# Patient Record
Sex: Female | Born: 1935 | Race: White | Hispanic: No | Smoking: Never smoker
Health system: Southern US, Community
[De-identification: ages and names within clinical notes are randomized; demographics above are authoritative.]

## PROBLEM LIST (undated history)

## (undated) DIAGNOSIS — E785 Hyperlipidemia, unspecified: Secondary | ICD-10-CM

## (undated) DIAGNOSIS — I10 Essential (primary) hypertension: Secondary | ICD-10-CM

## (undated) DIAGNOSIS — I2510 Atherosclerotic heart disease of native coronary artery without angina pectoris: Secondary | ICD-10-CM

## (undated) DIAGNOSIS — C801 Malignant (primary) neoplasm, unspecified: Secondary | ICD-10-CM

## (undated) DIAGNOSIS — I6529 Occlusion and stenosis of unspecified carotid artery: Secondary | ICD-10-CM

## (undated) HISTORY — DX: Essential (primary) hypertension: I10

## (undated) HISTORY — DX: Occlusion and stenosis of unspecified carotid artery: I65.29

## (undated) HISTORY — PX: CORONARY ARTERY BYPASS GRAFT: SHX141

## (undated) HISTORY — DX: Hyperlipidemia, unspecified: E78.5

## (undated) HISTORY — PX: ABDOMINAL HYSTERECTOMY: SHX81

## (undated) HISTORY — PX: CHOLECYSTECTOMY: SHX55

## (undated) HISTORY — PX: CATARACT EXTRACTION: SUR2

## (undated) HISTORY — DX: Atherosclerotic heart disease of native coronary artery without angina pectoris: I25.10

---

## 1964-10-02 HISTORY — PX: ABDOMINAL HYSTERECTOMY: SHX81

## 2005-09-08 ENCOUNTER — Ambulatory Visit: Payer: Self-pay | Admitting: Family Medicine

## 2005-10-25 ENCOUNTER — Ambulatory Visit (HOSPITAL_COMMUNITY): Admission: RE | Admit: 2005-10-25 | Discharge: 2005-10-25 | Payer: Self-pay | Admitting: *Deleted

## 2005-10-27 ENCOUNTER — Inpatient Hospital Stay (HOSPITAL_COMMUNITY): Admission: EM | Admit: 2005-10-27 | Discharge: 2005-11-02 | Payer: Self-pay | Admitting: Emergency Medicine

## 2005-10-27 HISTORY — PX: CARDIAC CATHETERIZATION: SHX172

## 2005-10-28 HISTORY — PX: CORONARY ARTERY BYPASS GRAFT: SHX141

## 2005-11-23 ENCOUNTER — Encounter
Admission: RE | Admit: 2005-11-23 | Discharge: 2005-11-23 | Payer: Self-pay | Admitting: Thoracic Surgery (Cardiothoracic Vascular Surgery)

## 2005-12-01 ENCOUNTER — Ambulatory Visit (HOSPITAL_COMMUNITY): Admission: RE | Admit: 2005-12-01 | Discharge: 2005-12-01 | Payer: Self-pay | Admitting: *Deleted

## 2005-12-05 ENCOUNTER — Ambulatory Visit (HOSPITAL_COMMUNITY)
Admission: RE | Admit: 2005-12-05 | Discharge: 2005-12-05 | Payer: Self-pay | Admitting: Thoracic Surgery (Cardiothoracic Vascular Surgery)

## 2005-12-05 ENCOUNTER — Encounter: Admission: RE | Admit: 2005-12-05 | Discharge: 2005-12-05 | Payer: Self-pay | Admitting: Surgery

## 2005-12-05 ENCOUNTER — Encounter (HOSPITAL_COMMUNITY): Admission: RE | Admit: 2005-12-05 | Discharge: 2006-01-04 | Payer: Self-pay | Admitting: *Deleted

## 2005-12-12 ENCOUNTER — Ambulatory Visit: Payer: Self-pay | Admitting: Family Medicine

## 2005-12-18 ENCOUNTER — Encounter
Admission: RE | Admit: 2005-12-18 | Discharge: 2005-12-18 | Payer: Self-pay | Admitting: Thoracic Surgery (Cardiothoracic Vascular Surgery)

## 2005-12-18 ENCOUNTER — Encounter (INDEPENDENT_AMBULATORY_CARE_PROVIDER_SITE_OTHER): Payer: Self-pay | Admitting: Family Medicine

## 2006-01-05 ENCOUNTER — Encounter (HOSPITAL_COMMUNITY): Admission: RE | Admit: 2006-01-05 | Discharge: 2006-02-04 | Payer: Self-pay | Admitting: *Deleted

## 2006-01-08 ENCOUNTER — Encounter
Admission: RE | Admit: 2006-01-08 | Discharge: 2006-01-08 | Payer: Self-pay | Admitting: Thoracic Surgery (Cardiothoracic Vascular Surgery)

## 2006-01-11 ENCOUNTER — Ambulatory Visit (HOSPITAL_COMMUNITY): Admission: RE | Admit: 2006-01-11 | Discharge: 2006-01-11 | Payer: Self-pay | Admitting: Family Medicine

## 2006-01-11 ENCOUNTER — Ambulatory Visit: Payer: Self-pay | Admitting: Family Medicine

## 2006-01-25 ENCOUNTER — Ambulatory Visit: Payer: Self-pay | Admitting: Family Medicine

## 2006-01-30 ENCOUNTER — Ambulatory Visit (HOSPITAL_COMMUNITY): Admission: RE | Admit: 2006-01-30 | Discharge: 2006-01-30 | Payer: Self-pay | Admitting: Family Medicine

## 2006-02-09 ENCOUNTER — Ambulatory Visit: Payer: Self-pay | Admitting: Family Medicine

## 2006-03-07 ENCOUNTER — Ambulatory Visit: Payer: Self-pay | Admitting: Family Medicine

## 2006-03-09 ENCOUNTER — Encounter (INDEPENDENT_AMBULATORY_CARE_PROVIDER_SITE_OTHER): Payer: Self-pay | Admitting: Family Medicine

## 2006-03-09 LAB — CONVERTED CEMR LAB
RBC count: 4.18 10*6/uL
TSH: 1.892 microintl units/mL
WBC, blood: 5.1 10*3/uL

## 2006-06-06 ENCOUNTER — Ambulatory Visit: Payer: Self-pay | Admitting: Family Medicine

## 2006-06-13 ENCOUNTER — Ambulatory Visit: Payer: Self-pay | Admitting: Family Medicine

## 2006-08-02 ENCOUNTER — Encounter (INDEPENDENT_AMBULATORY_CARE_PROVIDER_SITE_OTHER): Payer: Self-pay | Admitting: Family Medicine

## 2006-08-02 LAB — CONVERTED CEMR LAB: Blood Glucose, Fasting: 86 mg/dL

## 2006-08-13 ENCOUNTER — Ambulatory Visit: Payer: Self-pay | Admitting: Family Medicine

## 2006-09-06 ENCOUNTER — Encounter: Payer: Self-pay | Admitting: Family Medicine

## 2006-09-06 DIAGNOSIS — G43909 Migraine, unspecified, not intractable, without status migrainosus: Secondary | ICD-10-CM | POA: Insufficient documentation

## 2006-09-06 DIAGNOSIS — M810 Age-related osteoporosis without current pathological fracture: Secondary | ICD-10-CM | POA: Insufficient documentation

## 2006-09-06 DIAGNOSIS — M1990 Unspecified osteoarthritis, unspecified site: Secondary | ICD-10-CM | POA: Insufficient documentation

## 2006-09-06 DIAGNOSIS — I10 Essential (primary) hypertension: Secondary | ICD-10-CM | POA: Insufficient documentation

## 2006-09-06 DIAGNOSIS — K219 Gastro-esophageal reflux disease without esophagitis: Secondary | ICD-10-CM

## 2006-09-06 DIAGNOSIS — E785 Hyperlipidemia, unspecified: Secondary | ICD-10-CM | POA: Insufficient documentation

## 2006-09-19 ENCOUNTER — Ambulatory Visit: Payer: Self-pay | Admitting: Family Medicine

## 2006-10-24 ENCOUNTER — Ambulatory Visit: Payer: Self-pay | Admitting: Family Medicine

## 2006-11-07 ENCOUNTER — Ambulatory Visit: Payer: Self-pay | Admitting: Family Medicine

## 2006-11-21 ENCOUNTER — Ambulatory Visit: Payer: Self-pay | Admitting: Family Medicine

## 2006-11-21 ENCOUNTER — Ambulatory Visit (HOSPITAL_COMMUNITY): Admission: RE | Admit: 2006-11-21 | Discharge: 2006-11-21 | Payer: Self-pay | Admitting: Family Medicine

## 2006-11-21 DIAGNOSIS — Z951 Presence of aortocoronary bypass graft: Secondary | ICD-10-CM | POA: Insufficient documentation

## 2006-11-22 LAB — CONVERTED CEMR LAB
ALT: 47 units/L — ABNORMAL HIGH (ref 0–35)
AST: 39 units/L — ABNORMAL HIGH (ref 0–37)
Basophils Absolute: 0 10*3/uL (ref 0.0–0.1)
Basophils Relative: 0 % (ref 0–1)
CO2: 19 meq/L (ref 19–32)
Chloride: 113 meq/L — ABNORMAL HIGH (ref 96–112)
Eosinophils Relative: 3 % (ref 0–5)
Lymphocytes Relative: 25 % (ref 12–46)
Neutro Abs: 3.3 10*3/uL (ref 1.7–7.7)
Platelets: 213 10*3/uL (ref 150–400)
Pro B Natriuretic peptide (BNP): 581 pg/mL — ABNORMAL HIGH (ref 0.0–100.0)
RDW: 13.2 % (ref 11.5–14.0)
Sodium: 146 meq/L — ABNORMAL HIGH (ref 135–145)
Total Bilirubin: 0.5 mg/dL (ref 0.3–1.2)
Total Protein: 5.9 g/dL — ABNORMAL LOW (ref 6.0–8.3)

## 2006-11-23 ENCOUNTER — Ambulatory Visit: Payer: Self-pay | Admitting: Family Medicine

## 2006-11-23 DIAGNOSIS — D649 Anemia, unspecified: Secondary | ICD-10-CM

## 2006-11-26 ENCOUNTER — Ambulatory Visit (HOSPITAL_COMMUNITY): Admission: RE | Admit: 2006-11-26 | Discharge: 2006-11-26 | Payer: Self-pay | Admitting: Family Medicine

## 2006-12-05 ENCOUNTER — Encounter (INDEPENDENT_AMBULATORY_CARE_PROVIDER_SITE_OTHER): Payer: Self-pay | Admitting: Family Medicine

## 2006-12-05 LAB — CONVERTED CEMR LAB: OCCULT 3: NEGATIVE

## 2006-12-07 ENCOUNTER — Ambulatory Visit: Payer: Self-pay | Admitting: Family Medicine

## 2006-12-07 LAB — CONVERTED CEMR LAB
ALT: 15 units/L (ref 0–35)
AST: 23 units/L (ref 0–37)
Alkaline Phosphatase: 80 units/L (ref 39–117)
Folate: >20 ng/mL
Indirect Bilirubin: 0.3 mg/dL (ref 0.0–0.9)
Retic Count, Absolute: 38.8 (ref 19.0–186.0)
Retic Ct Pct: 1 % (ref 0.4–3.1)
Saturation Ratios: 15 % — ABNORMAL LOW (ref 20–55)
Total Protein: 6.1 g/dL (ref 6.0–8.3)
Vitamin B-12: 487 pg/mL (ref 211–911)

## 2006-12-10 ENCOUNTER — Encounter (INDEPENDENT_AMBULATORY_CARE_PROVIDER_SITE_OTHER): Payer: Self-pay | Admitting: Family Medicine

## 2006-12-12 ENCOUNTER — Encounter (INDEPENDENT_AMBULATORY_CARE_PROVIDER_SITE_OTHER): Payer: Self-pay | Admitting: Family Medicine

## 2007-01-04 ENCOUNTER — Encounter (INDEPENDENT_AMBULATORY_CARE_PROVIDER_SITE_OTHER): Payer: Self-pay | Admitting: Family Medicine

## 2007-01-09 ENCOUNTER — Ambulatory Visit: Payer: Self-pay | Admitting: Family Medicine

## 2007-01-09 ENCOUNTER — Other Ambulatory Visit: Admission: RE | Admit: 2007-01-09 | Discharge: 2007-01-09 | Payer: Self-pay | Admitting: Family Medicine

## 2007-01-09 ENCOUNTER — Encounter (INDEPENDENT_AMBULATORY_CARE_PROVIDER_SITE_OTHER): Payer: Self-pay | Admitting: *Deleted

## 2007-01-09 LAB — CONVERTED CEMR LAB
HDL goal, serum: 40 mg/dL
LDL Goal: 100 mg/dL

## 2007-01-16 ENCOUNTER — Ambulatory Visit (HOSPITAL_COMMUNITY): Admission: RE | Admit: 2007-01-16 | Discharge: 2007-01-16 | Payer: Self-pay | Admitting: Family Medicine

## 2007-01-16 ENCOUNTER — Encounter (INDEPENDENT_AMBULATORY_CARE_PROVIDER_SITE_OTHER): Payer: Self-pay | Admitting: Family Medicine

## 2007-01-18 ENCOUNTER — Encounter (INDEPENDENT_AMBULATORY_CARE_PROVIDER_SITE_OTHER): Payer: Self-pay | Admitting: Family Medicine

## 2007-02-06 ENCOUNTER — Ambulatory Visit: Payer: Self-pay | Admitting: Family Medicine

## 2007-02-08 ENCOUNTER — Telehealth (INDEPENDENT_AMBULATORY_CARE_PROVIDER_SITE_OTHER): Payer: Self-pay | Admitting: *Deleted

## 2007-02-13 ENCOUNTER — Encounter (INDEPENDENT_AMBULATORY_CARE_PROVIDER_SITE_OTHER): Payer: Self-pay | Admitting: Family Medicine

## 2007-02-28 ENCOUNTER — Encounter (INDEPENDENT_AMBULATORY_CARE_PROVIDER_SITE_OTHER): Payer: Self-pay | Admitting: Family Medicine

## 2007-03-07 ENCOUNTER — Encounter (INDEPENDENT_AMBULATORY_CARE_PROVIDER_SITE_OTHER): Payer: Self-pay | Admitting: Family Medicine

## 2007-04-18 ENCOUNTER — Encounter (INDEPENDENT_AMBULATORY_CARE_PROVIDER_SITE_OTHER): Payer: Self-pay | Admitting: Family Medicine

## 2007-04-24 ENCOUNTER — Telehealth (INDEPENDENT_AMBULATORY_CARE_PROVIDER_SITE_OTHER): Payer: Self-pay | Admitting: Family Medicine

## 2007-05-08 ENCOUNTER — Telehealth (INDEPENDENT_AMBULATORY_CARE_PROVIDER_SITE_OTHER): Payer: Self-pay | Admitting: *Deleted

## 2007-05-08 ENCOUNTER — Ambulatory Visit: Payer: Self-pay | Admitting: Family Medicine

## 2007-05-08 DIAGNOSIS — M5030 Other cervical disc degeneration, unspecified cervical region: Secondary | ICD-10-CM | POA: Insufficient documentation

## 2007-05-09 ENCOUNTER — Telehealth (INDEPENDENT_AMBULATORY_CARE_PROVIDER_SITE_OTHER): Payer: Self-pay | Admitting: *Deleted

## 2007-05-09 LAB — CONVERTED CEMR LAB
ALT: 16 units/L (ref 0–35)
CO2: 21 meq/L (ref 19–32)
Calcium: 8.4 mg/dL (ref 8.4–10.5)
Chloride: 108 meq/L (ref 96–112)
Cholesterol: 135 mg/dL (ref 0–200)
Creatinine, Ser: 0.83 mg/dL (ref 0.40–1.20)
Eosinophils Relative: 4 % (ref 0–5)
Glucose, Bld: 86 mg/dL (ref 70–99)
HCT: 36.1 % (ref 36.0–46.0)
Hemoglobin: 11.2 g/dL — ABNORMAL LOW (ref 12.0–15.0)
Lymphocytes Relative: 25 % (ref 12–46)
Lymphs Abs: 1.1 10*3/uL (ref 0.7–3.3)
Monocytes Absolute: 0.4 10*3/uL (ref 0.2–0.7)
Neutro Abs: 2.8 10*3/uL (ref 1.7–7.7)
RBC: 3.67 M/uL — ABNORMAL LOW (ref 3.87–5.11)
Total Bilirubin: 0.7 mg/dL (ref 0.3–1.2)
Total CHOL/HDL Ratio: 2.8
Total Protein: 6.5 g/dL (ref 6.0–8.3)
Triglycerides: 88 mg/dL (ref <150)
VLDL: 18 mg/dL (ref 0–40)
WBC: 4.5 10*3/uL (ref 4.0–10.5)

## 2007-05-10 ENCOUNTER — Encounter (INDEPENDENT_AMBULATORY_CARE_PROVIDER_SITE_OTHER): Payer: Self-pay | Admitting: Family Medicine

## 2007-05-10 ENCOUNTER — Telehealth (INDEPENDENT_AMBULATORY_CARE_PROVIDER_SITE_OTHER): Payer: Self-pay | Admitting: *Deleted

## 2007-05-10 ENCOUNTER — Ambulatory Visit (HOSPITAL_COMMUNITY): Admission: RE | Admit: 2007-05-10 | Discharge: 2007-05-10 | Payer: Self-pay | Admitting: Family Medicine

## 2007-05-13 ENCOUNTER — Telehealth (INDEPENDENT_AMBULATORY_CARE_PROVIDER_SITE_OTHER): Payer: Self-pay | Admitting: *Deleted

## 2007-05-13 ENCOUNTER — Encounter (INDEPENDENT_AMBULATORY_CARE_PROVIDER_SITE_OTHER): Payer: Self-pay | Admitting: Family Medicine

## 2007-05-16 ENCOUNTER — Encounter (INDEPENDENT_AMBULATORY_CARE_PROVIDER_SITE_OTHER): Payer: Self-pay | Admitting: Family Medicine

## 2007-05-16 ENCOUNTER — Ambulatory Visit (HOSPITAL_COMMUNITY): Admission: RE | Admit: 2007-05-16 | Discharge: 2007-05-16 | Payer: Self-pay | Admitting: Family Medicine

## 2007-05-17 ENCOUNTER — Encounter (INDEPENDENT_AMBULATORY_CARE_PROVIDER_SITE_OTHER): Payer: Self-pay | Admitting: Family Medicine

## 2007-05-22 ENCOUNTER — Ambulatory Visit: Payer: Self-pay | Admitting: Family Medicine

## 2007-05-23 ENCOUNTER — Telehealth (INDEPENDENT_AMBULATORY_CARE_PROVIDER_SITE_OTHER): Payer: Self-pay | Admitting: *Deleted

## 2007-05-23 ENCOUNTER — Encounter (INDEPENDENT_AMBULATORY_CARE_PROVIDER_SITE_OTHER): Payer: Self-pay | Admitting: Family Medicine

## 2007-05-23 LAB — CONVERTED CEMR LAB
Folate: >20 ng/mL
Iron: 75 ug/dL (ref 42–145)
Saturation Ratios: 23 % (ref 20–55)
UIBC: 246 ug/dL
Vitamin B-12: 718 pg/mL (ref 211–911)

## 2007-05-26 ENCOUNTER — Encounter (INDEPENDENT_AMBULATORY_CARE_PROVIDER_SITE_OTHER): Payer: Self-pay | Admitting: Family Medicine

## 2007-05-28 ENCOUNTER — Telehealth (INDEPENDENT_AMBULATORY_CARE_PROVIDER_SITE_OTHER): Payer: Self-pay | Admitting: *Deleted

## 2007-05-30 ENCOUNTER — Observation Stay (HOSPITAL_COMMUNITY): Admission: RE | Admit: 2007-05-30 | Discharge: 2007-05-31 | Payer: Self-pay | Admitting: Obstetrics and Gynecology

## 2007-05-30 ENCOUNTER — Encounter: Payer: Self-pay | Admitting: Obstetrics and Gynecology

## 2007-05-30 ENCOUNTER — Encounter (INDEPENDENT_AMBULATORY_CARE_PROVIDER_SITE_OTHER): Payer: Self-pay | Admitting: Family Medicine

## 2007-07-03 ENCOUNTER — Ambulatory Visit: Payer: Self-pay | Admitting: Family Medicine

## 2007-07-03 DIAGNOSIS — IMO0002 Reserved for concepts with insufficient information to code with codable children: Secondary | ICD-10-CM

## 2007-07-03 DIAGNOSIS — M4800 Spinal stenosis, site unspecified: Secondary | ICD-10-CM | POA: Insufficient documentation

## 2007-07-08 ENCOUNTER — Encounter (INDEPENDENT_AMBULATORY_CARE_PROVIDER_SITE_OTHER): Payer: Self-pay | Admitting: Family Medicine

## 2007-08-26 ENCOUNTER — Telehealth (INDEPENDENT_AMBULATORY_CARE_PROVIDER_SITE_OTHER): Payer: Self-pay | Admitting: Family Medicine

## 2007-09-30 ENCOUNTER — Encounter (INDEPENDENT_AMBULATORY_CARE_PROVIDER_SITE_OTHER): Payer: Self-pay | Admitting: Family Medicine

## 2007-10-03 ENCOUNTER — Encounter: Payer: Self-pay | Admitting: Family Medicine

## 2007-10-09 ENCOUNTER — Ambulatory Visit: Payer: Self-pay | Admitting: Family Medicine

## 2007-10-10 LAB — CONVERTED CEMR LAB
Basophils Absolute: 0 10*3/uL (ref 0.0–0.1)
Basophils Relative: 1 % (ref 0–1)
Eosinophils Absolute: 0.3 10*3/uL (ref 0.0–0.7)
MCHC: 31.9 g/dL (ref 30.0–36.0)
MCV: 91.6 fL (ref 78.0–100.0)
Neutro Abs: 2.6 10*3/uL (ref 1.7–7.7)
Neutrophils Relative %: 58 % (ref 43–77)
Platelets: 184 10*3/uL (ref 150–400)
RBC: 3.59 M/uL — ABNORMAL LOW (ref 3.87–5.11)
RDW: 14.4 % (ref 11.5–15.5)

## 2007-10-15 ENCOUNTER — Encounter (INDEPENDENT_AMBULATORY_CARE_PROVIDER_SITE_OTHER): Payer: Self-pay | Admitting: Family Medicine

## 2007-11-07 ENCOUNTER — Encounter (INDEPENDENT_AMBULATORY_CARE_PROVIDER_SITE_OTHER): Payer: Self-pay | Admitting: Family Medicine

## 2007-11-20 ENCOUNTER — Ambulatory Visit: Payer: Self-pay | Admitting: Family Medicine

## 2007-11-20 DIAGNOSIS — K5900 Constipation, unspecified: Secondary | ICD-10-CM | POA: Insufficient documentation

## 2007-11-20 LAB — CONVERTED CEMR LAB
Calcium: 9.3 mg/dL (ref 8.4–10.5)
Phosphorus: 3.5 mg/dL (ref 2.3–4.6)

## 2007-11-22 ENCOUNTER — Encounter (INDEPENDENT_AMBULATORY_CARE_PROVIDER_SITE_OTHER): Payer: Self-pay | Admitting: Family Medicine

## 2007-11-25 ENCOUNTER — Encounter (INDEPENDENT_AMBULATORY_CARE_PROVIDER_SITE_OTHER): Payer: Self-pay | Admitting: Family Medicine

## 2007-11-26 LAB — CONVERTED CEMR LAB: Creatinine, Ser: 0.86 mg/dL (ref 0.40–1.20)

## 2007-11-26 IMAGING — CR DG CHEST 1V PORT
1 series · 1 of 1 positions shown · non-contrast
Comparison: Earlier examination today at 2213 hours.

CLINICAL DATA: Unstable angina, pre CABG workup.
 PORTABLE CHEST ? 1 VIEW ? 0002 hours:

[view not recorded]
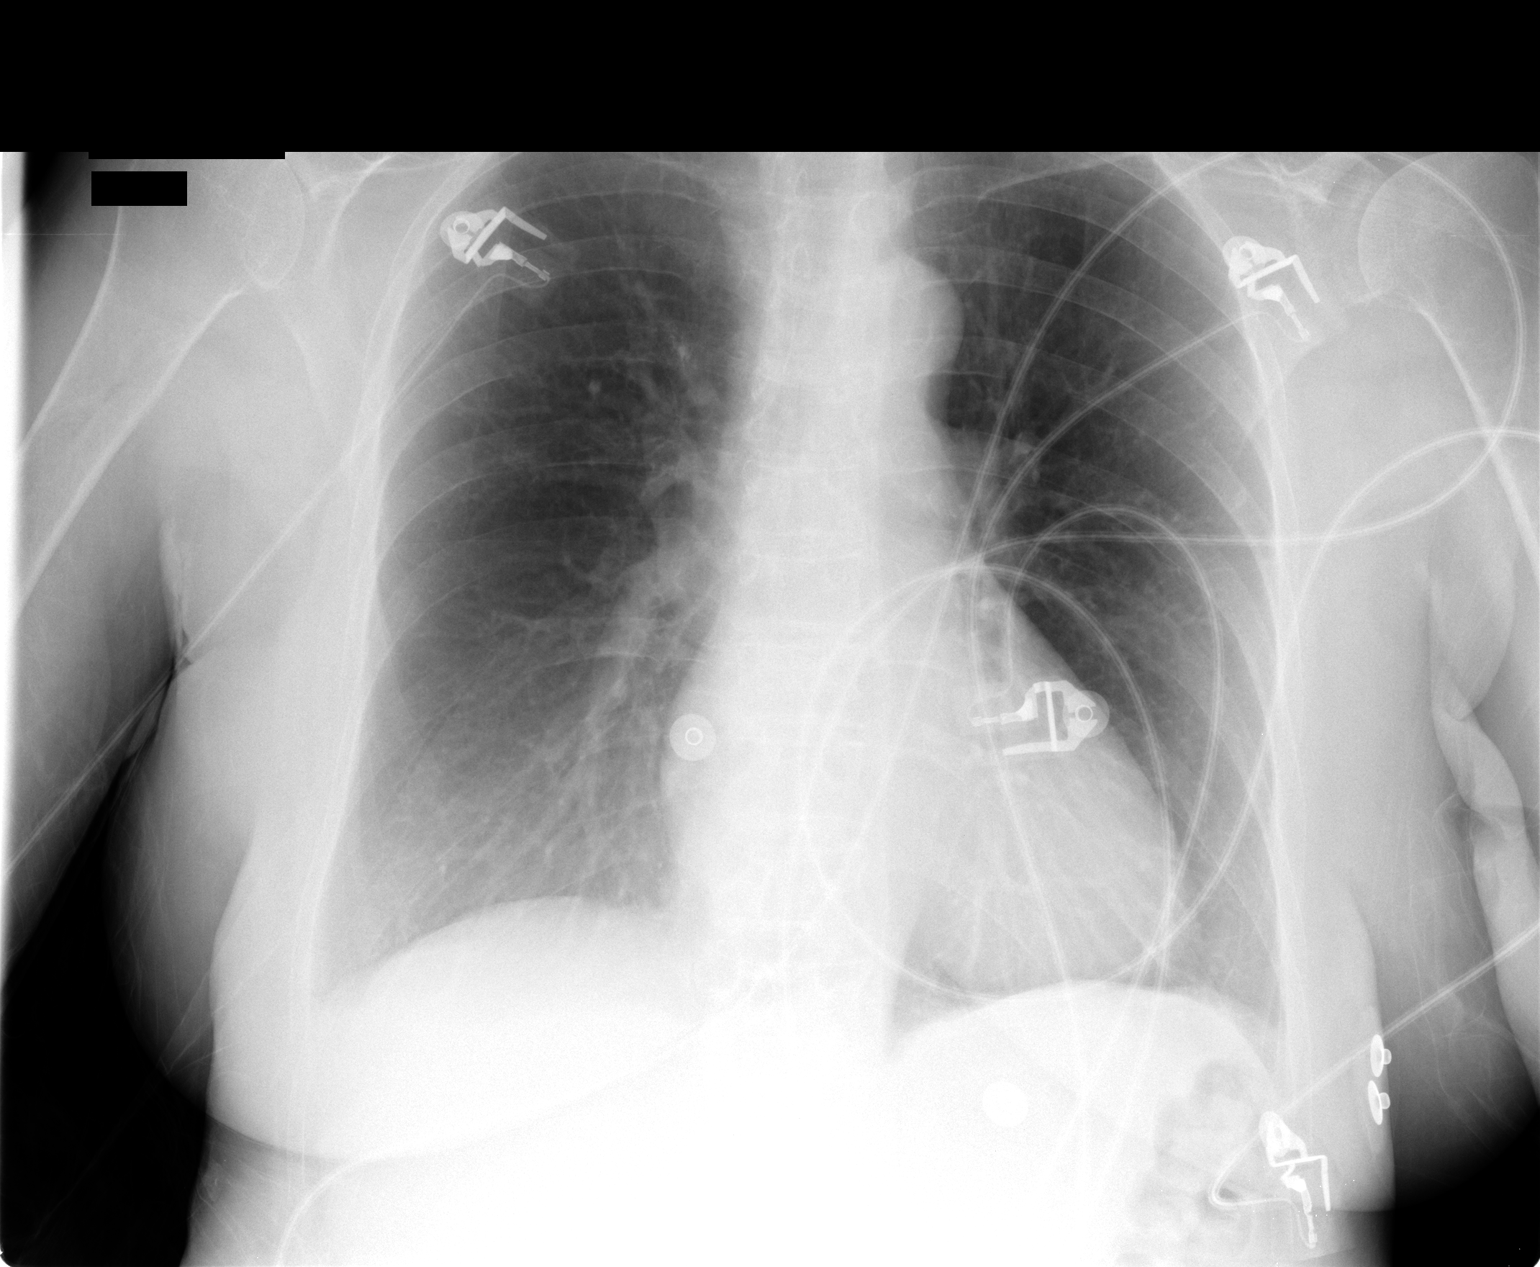

[1 of 1 positions shown; findings below may reference images not displayed]

FINDINGS: Cardiac size probably towards upper limits of normal.  Some redistribution of pulmonary blood flow to the upper lung zones suggesting mild pulmonary venous hypertension.  No CHF or active lung process.
IMPRESSION: Cardiac size mildly prominent for this single view.  Query pulmonary venous hypertension.  No CHF.

## 2007-11-27 IMAGING — CR DG CHEST 1V PORT
1 series · 1 of 1 positions shown · non-contrast
Comparison: 10/27/05.

CLINICAL DATA: Status post CABG.
 PORTABLE CHEST - 1 VIEW - 10/28/05 AT 5553 HOURS:

[view not recorded]
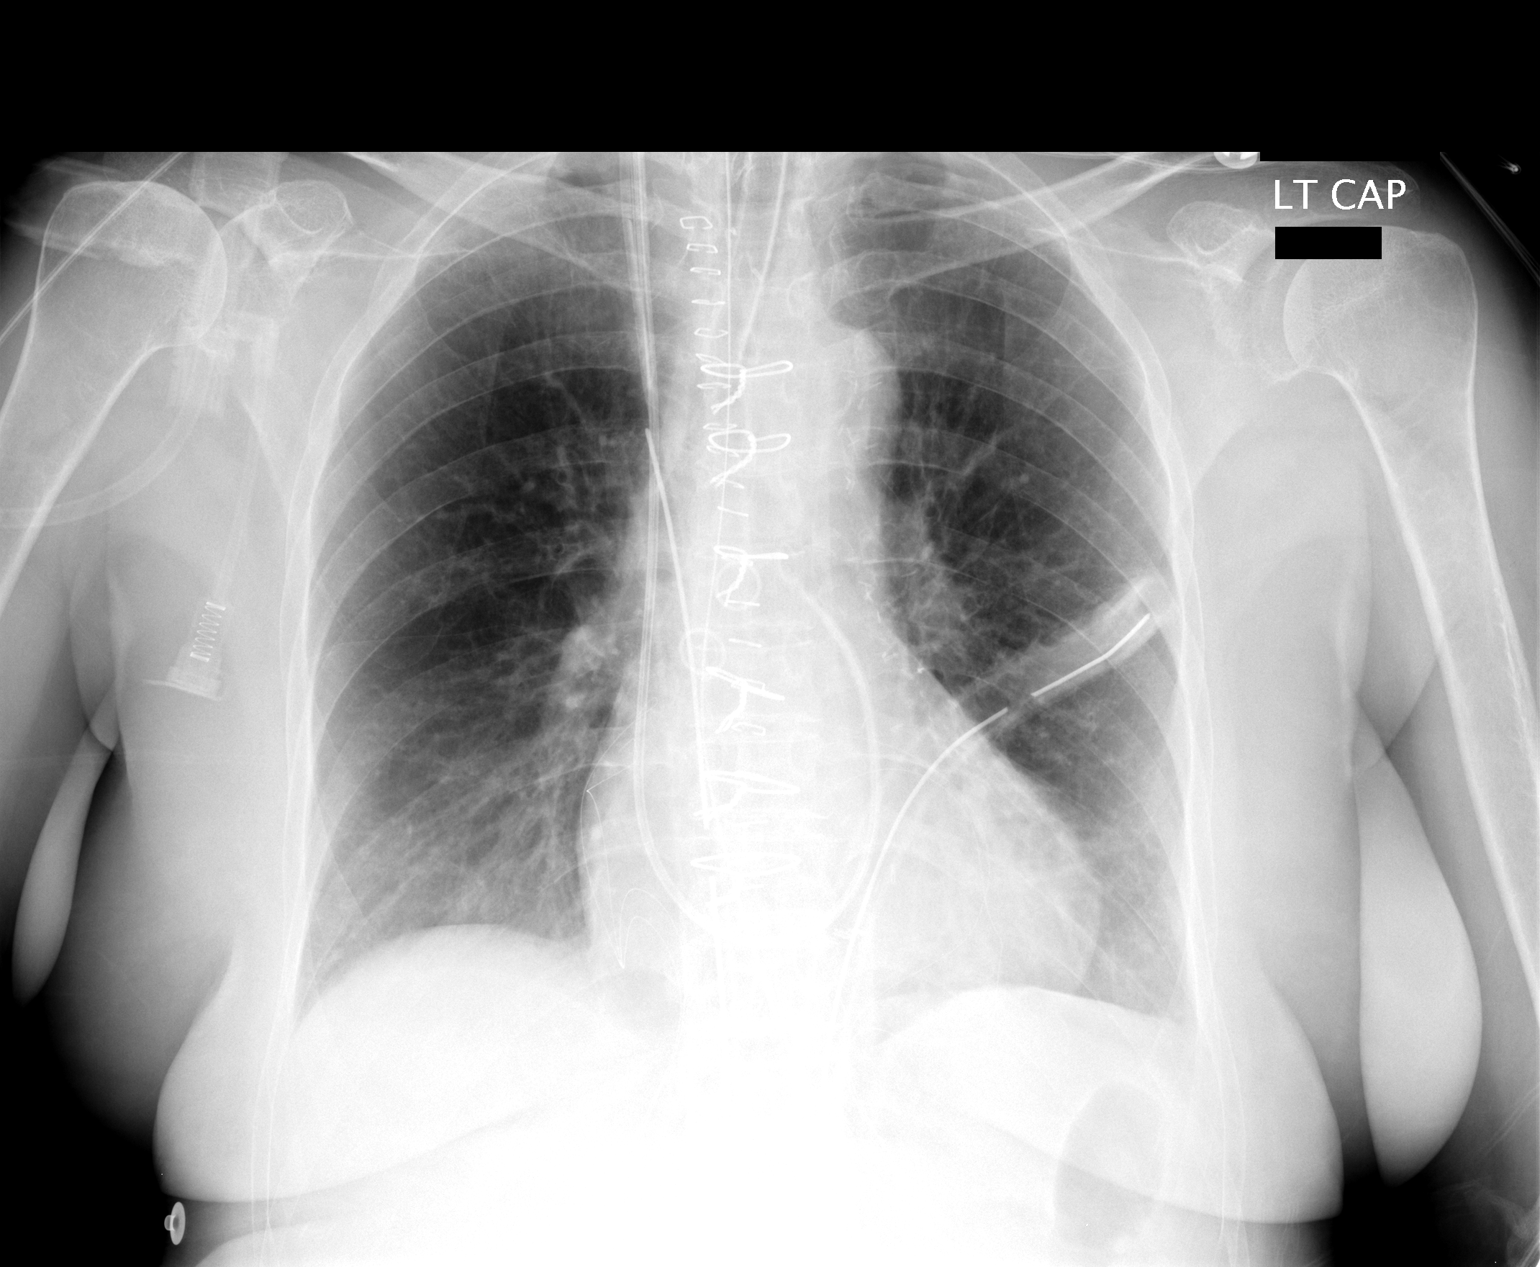

[1 of 1 positions shown; findings below may reference images not displayed]

FINDINGS: The endotracheal tube tip lies 2 cm above the carina.  The Swan-Ganz catheter tip is in the proximal right pulmonary artery.  A left chest tube is in place.  There is a small less than 5 percent left apical pneumothorax.  No significant atelectasis or edema.  Stable heart size postoperatively.
IMPRESSION: Less than 5 percent left apical pneumothorax.
 Satisfactory positioning of endotracheal tube and Swan-Ganz catheter.

## 2007-11-29 IMAGING — CR DG CHEST 1V PORT
1 series · 1 of 1 positions shown · non-contrast
Comparison: none

CLINICAL DATA: Unstable angina. 
 PORTABLE CHEST - 1 VIEW, 10/30/05, 2622 HOURS:
 Comparison;  10/29/05.

[view not recorded]
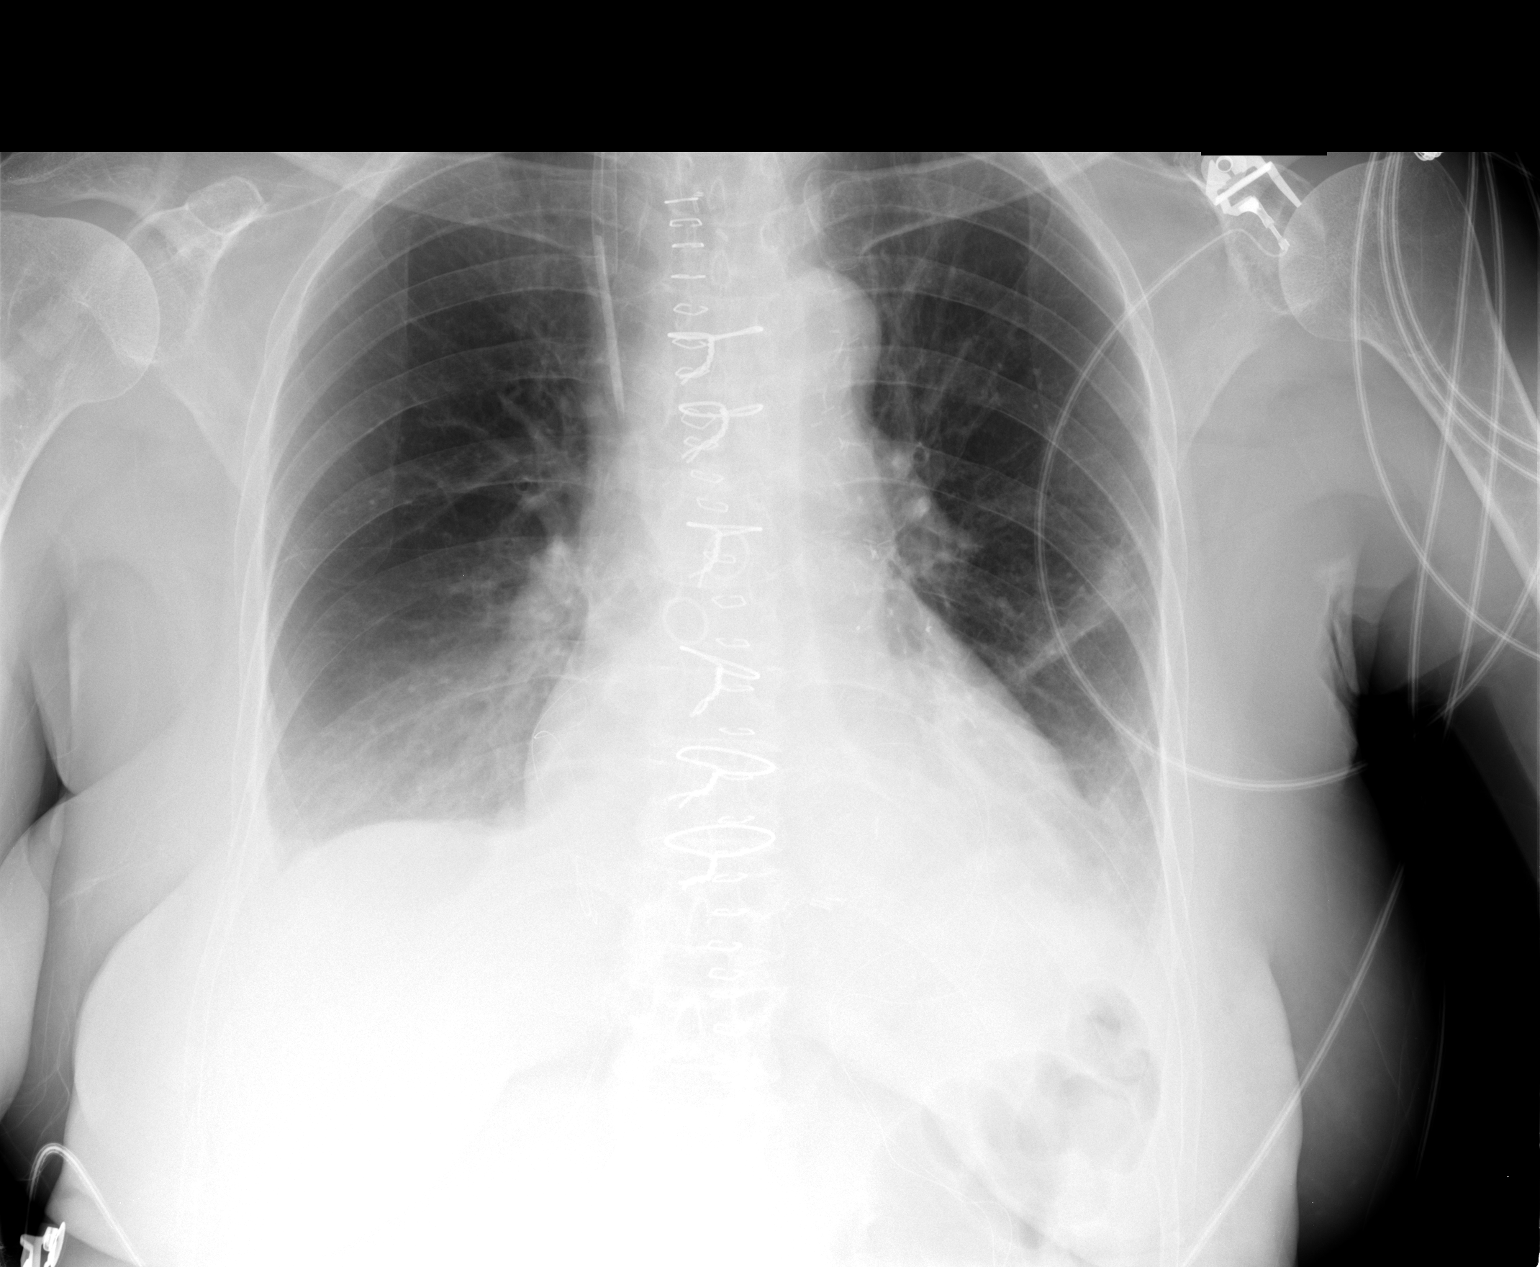

[1 of 1 positions shown; findings below may reference images not displayed]

FINDINGS: A right IJ vein central venous catheter is stable.  The Swan Ganz catheter has been removed with the right IJ vein introducer left in place.  Chest and mediastinal tubes have been removed.  A tiny left apical pneumothorax is present.  Bibasilar atelectasis left greater than right has worsened.  The heart remains upper normal in size.  The pulmonary vasculature is within normal limits.
IMPRESSION: Removal of chest tubes with tiny left apical pneumothorax. Worsening bibasilar atelectasis.  No congestive heart failure.

## 2007-12-04 ENCOUNTER — Ambulatory Visit: Payer: Self-pay | Admitting: Family Medicine

## 2007-12-18 ENCOUNTER — Encounter (INDEPENDENT_AMBULATORY_CARE_PROVIDER_SITE_OTHER): Payer: Self-pay | Admitting: Family Medicine

## 2008-01-03 ENCOUNTER — Ambulatory Visit: Payer: Self-pay | Admitting: Family Medicine

## 2008-01-03 LAB — CONVERTED CEMR LAB
Blood in Urine, dipstick: NEGATIVE
Ketones, urine, test strip: NEGATIVE
Protein, U semiquant: NEGATIVE
Specific Gravity, Urine: 1.01
Urobilinogen, UA: 0.2
pH: 5

## 2008-01-04 IMAGING — CR DG CHEST 2V
2 series · 2 of 2 positions shown · non-contrast
Comparison: 11/23/05
 Two views of the chest show a moderate-sized left pleural effusion has reaccumulated.  The right lung is clear.  The heart size is stable.

CLINICAL DATA: CABG 10/28/05.  Shortness of breath.  Cough.
 [id number] VIEWS:

[view not recorded (1 of 2)]
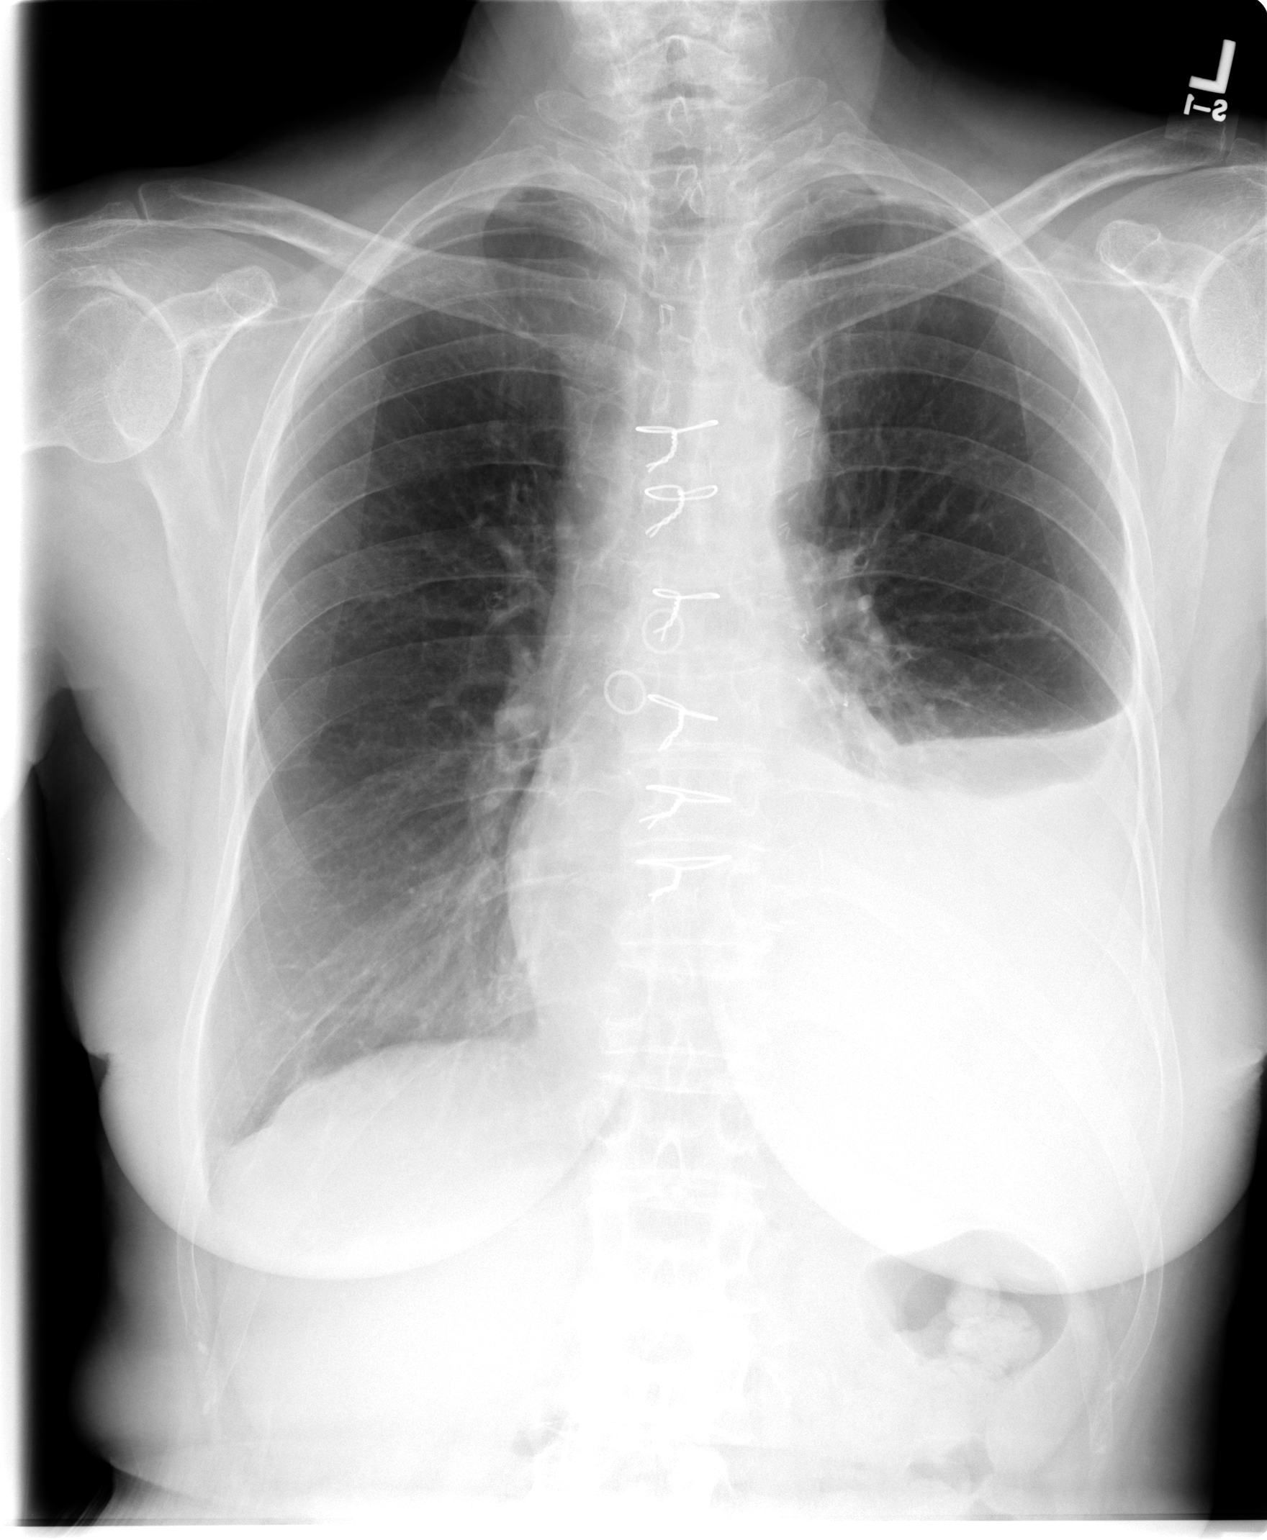

[view not recorded (2 of 2)]
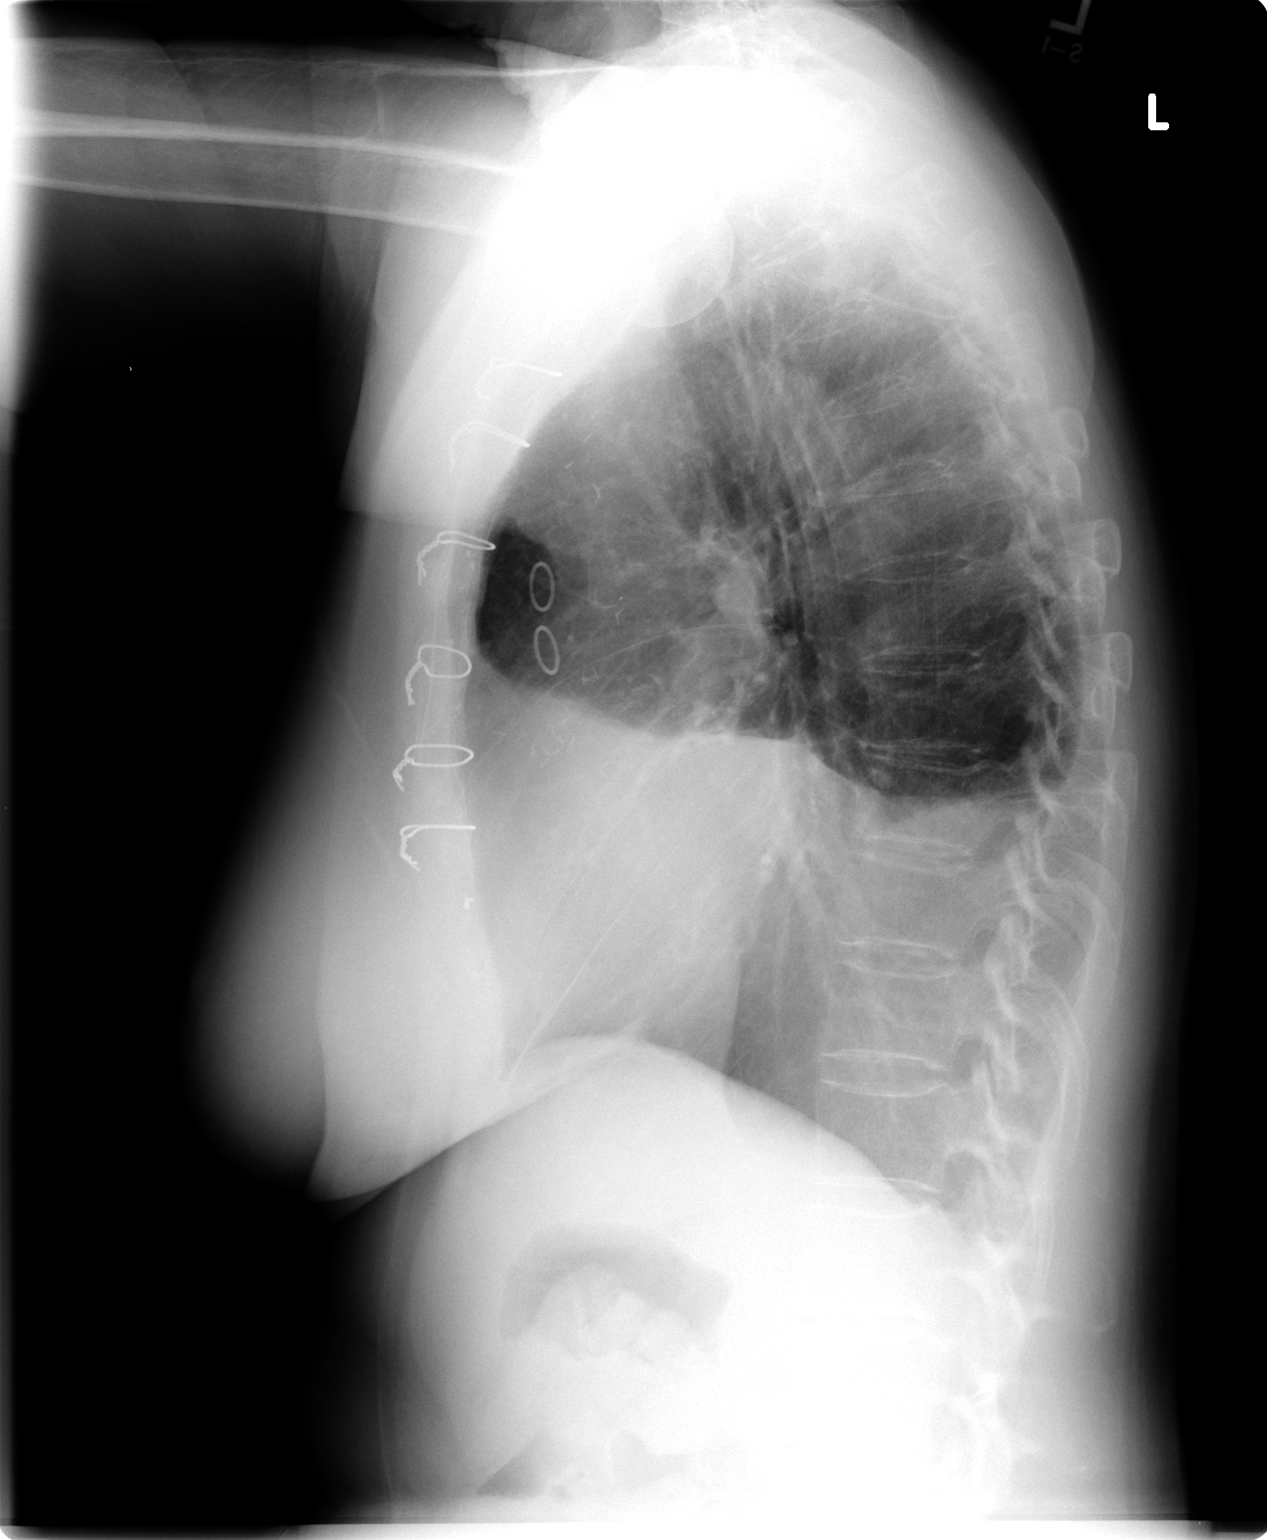

[2 of 2 positions shown; findings below may reference images not displayed]

IMPRESSION: Reaccumulation of a moderate-sized left pleural effusion.

## 2008-01-21 ENCOUNTER — Encounter (INDEPENDENT_AMBULATORY_CARE_PROVIDER_SITE_OTHER): Payer: Self-pay | Admitting: Family Medicine

## 2008-01-28 ENCOUNTER — Encounter (INDEPENDENT_AMBULATORY_CARE_PROVIDER_SITE_OTHER): Payer: Self-pay | Admitting: Family Medicine

## 2008-02-11 ENCOUNTER — Other Ambulatory Visit: Admission: RE | Admit: 2008-02-11 | Discharge: 2008-02-11 | Payer: Self-pay | Admitting: Obstetrics and Gynecology

## 2008-02-17 ENCOUNTER — Telehealth (INDEPENDENT_AMBULATORY_CARE_PROVIDER_SITE_OTHER): Payer: Self-pay | Admitting: *Deleted

## 2008-02-17 ENCOUNTER — Ambulatory Visit: Payer: Self-pay | Admitting: Family Medicine

## 2008-02-17 ENCOUNTER — Ambulatory Visit (HOSPITAL_COMMUNITY): Admission: RE | Admit: 2008-02-17 | Discharge: 2008-02-17 | Payer: Self-pay | Admitting: Family Medicine

## 2008-02-17 DIAGNOSIS — J301 Allergic rhinitis due to pollen: Secondary | ICD-10-CM

## 2008-02-17 LAB — CONVERTED CEMR LAB
Bilirubin Urine: NEGATIVE
Blood in Urine, dipstick: NEGATIVE
Nitrite: NEGATIVE
Protein, U semiquant: 30
WBC Urine, dipstick: NEGATIVE

## 2008-02-18 ENCOUNTER — Emergency Department (HOSPITAL_COMMUNITY): Admission: EM | Admit: 2008-02-18 | Discharge: 2008-02-18 | Payer: Self-pay | Admitting: Emergency Medicine

## 2008-02-18 ENCOUNTER — Ambulatory Visit (HOSPITAL_COMMUNITY): Admission: RE | Admit: 2008-02-18 | Discharge: 2008-02-18 | Payer: Self-pay | Admitting: Family Medicine

## 2008-02-19 ENCOUNTER — Ambulatory Visit: Payer: Self-pay | Admitting: Cardiovascular Disease

## 2008-02-19 ENCOUNTER — Ambulatory Visit: Payer: Self-pay | Admitting: Family Medicine

## 2008-02-20 LAB — CONVERTED CEMR LAB
BUN: 17 mg/dL (ref 6–23)
Basophils Relative: 1 % (ref 0–1)
CO2: 22 meq/L (ref 19–32)
Calcium: 9.9 mg/dL (ref 8.4–10.5)
Chloride: 106 meq/L (ref 96–112)
Cholesterol: 165 mg/dL (ref 0–200)
Creatinine, Ser: 0.66 mg/dL (ref 0.40–1.20)
Eosinophils Relative: 5 % (ref 0–5)
HCT: 39.1 % (ref 36.0–46.0)
HDL: 48 mg/dL (ref >39)
Hemoglobin: 12.3 g/dL (ref 12.0–15.0)
Lymphocytes Relative: 28 % (ref 12–46)
MCHC: 31.5 g/dL (ref 30.0–36.0)
Monocytes Absolute: 0.4 10*3/uL (ref 0.1–1.0)
Monocytes Relative: 9 % (ref 3–12)
Neutro Abs: 2.5 10*3/uL (ref 1.7–7.7)
RBC: 4.08 M/uL (ref 3.87–5.11)
Total CHOL/HDL Ratio: 3.4

## 2008-02-27 ENCOUNTER — Encounter (INDEPENDENT_AMBULATORY_CARE_PROVIDER_SITE_OTHER): Payer: Self-pay | Admitting: Family Medicine

## 2008-03-02 ENCOUNTER — Telehealth (INDEPENDENT_AMBULATORY_CARE_PROVIDER_SITE_OTHER): Payer: Self-pay | Admitting: Family Medicine

## 2008-03-18 ENCOUNTER — Ambulatory Visit: Payer: Self-pay | Admitting: Family Medicine

## 2008-04-29 ENCOUNTER — Encounter (INDEPENDENT_AMBULATORY_CARE_PROVIDER_SITE_OTHER): Payer: Self-pay | Admitting: Family Medicine

## 2008-04-30 ENCOUNTER — Encounter (INDEPENDENT_AMBULATORY_CARE_PROVIDER_SITE_OTHER): Payer: Self-pay | Admitting: Family Medicine

## 2008-06-17 ENCOUNTER — Ambulatory Visit: Payer: Self-pay | Admitting: Family Medicine

## 2008-06-18 ENCOUNTER — Encounter (INDEPENDENT_AMBULATORY_CARE_PROVIDER_SITE_OTHER): Payer: Self-pay | Admitting: Family Medicine

## 2008-06-18 LAB — CONVERTED CEMR LAB
ALT: 14 units/L (ref 0–35)
Basophils Absolute: 0 10*3/uL (ref 0.0–0.1)
Basophils Relative: 0 % (ref 0–1)
CO2: 22 meq/L (ref 19–32)
Creatinine, Ser: 0.69 mg/dL (ref 0.40–1.20)
Eosinophils Relative: 4 % (ref 0–5)
HCT: 36 % (ref 36.0–46.0)
Hemoglobin: 11.7 g/dL — ABNORMAL LOW (ref 12.0–15.0)
Lymphocytes Relative: 22 % (ref 12–46)
MCHC: 32.5 g/dL (ref 30.0–36.0)
MCV: 94.5 fL (ref 78.0–100.0)
Monocytes Absolute: 0.4 10*3/uL (ref 0.1–1.0)
RDW: 14.1 % (ref 11.5–15.5)
Total Bilirubin: 0.6 mg/dL (ref 0.3–1.2)
Vitamin B-12: 390 pg/mL (ref 211–911)

## 2008-06-19 ENCOUNTER — Encounter (INDEPENDENT_AMBULATORY_CARE_PROVIDER_SITE_OTHER): Payer: Self-pay | Admitting: Family Medicine

## 2008-06-19 LAB — CONVERTED CEMR LAB
Iron: 98 ug/dL (ref 42–145)
Retic Ct Pct: 0.7 % (ref 0.4–3.1)
UIBC: 221 ug/dL

## 2008-06-23 ENCOUNTER — Ambulatory Visit (HOSPITAL_COMMUNITY): Admission: RE | Admit: 2008-06-23 | Discharge: 2008-06-23 | Payer: Self-pay | Admitting: Family Medicine

## 2008-06-23 ENCOUNTER — Encounter (INDEPENDENT_AMBULATORY_CARE_PROVIDER_SITE_OTHER): Payer: Self-pay | Admitting: Family Medicine

## 2008-07-10 ENCOUNTER — Encounter (INDEPENDENT_AMBULATORY_CARE_PROVIDER_SITE_OTHER): Payer: Self-pay | Admitting: Family Medicine

## 2008-07-15 ENCOUNTER — Encounter (INDEPENDENT_AMBULATORY_CARE_PROVIDER_SITE_OTHER): Payer: Self-pay | Admitting: Family Medicine

## 2008-07-30 ENCOUNTER — Ambulatory Visit: Payer: Self-pay | Admitting: Family Medicine

## 2008-09-09 ENCOUNTER — Ambulatory Visit: Payer: Self-pay | Admitting: Family Medicine

## 2008-09-10 ENCOUNTER — Encounter (INDEPENDENT_AMBULATORY_CARE_PROVIDER_SITE_OTHER): Payer: Self-pay | Admitting: Family Medicine

## 2008-09-10 LAB — CONVERTED CEMR LAB
AST: 23 units/L (ref 0–37)
Alkaline Phosphatase: 54 units/L (ref 39–117)
BUN: 16 mg/dL (ref 6–23)
Basophils Relative: 1 % (ref 0–1)
Creatinine, Ser: 0.76 mg/dL (ref 0.40–1.20)
Eosinophils Absolute: 0.2 10*3/uL (ref 0.0–0.7)
Eosinophils Relative: 5 % (ref 0–5)
Hemoglobin: 12 g/dL (ref 12.0–15.0)
MCHC: 31.5 g/dL (ref 30.0–36.0)
MCV: 92.9 fL (ref 78.0–100.0)
Monocytes Absolute: 0.3 10*3/uL (ref 0.1–1.0)
Monocytes Relative: 7 % (ref 3–12)
Neutrophils Relative %: 60 % (ref 43–77)
RBC: 4.1 M/uL (ref 3.87–5.11)
Total Bilirubin: 0.5 mg/dL (ref 0.3–1.2)

## 2008-09-11 ENCOUNTER — Ambulatory Visit: Payer: Self-pay | Admitting: Family Medicine

## 2008-09-11 DIAGNOSIS — M6530 Trigger finger, unspecified finger: Secondary | ICD-10-CM | POA: Insufficient documentation

## 2008-10-30 ENCOUNTER — Encounter (INDEPENDENT_AMBULATORY_CARE_PROVIDER_SITE_OTHER): Payer: Self-pay | Admitting: Family Medicine

## 2008-12-03 ENCOUNTER — Ambulatory Visit: Payer: Self-pay | Admitting: Family Medicine

## 2008-12-04 ENCOUNTER — Encounter (INDEPENDENT_AMBULATORY_CARE_PROVIDER_SITE_OTHER): Payer: Self-pay | Admitting: Family Medicine

## 2008-12-04 LAB — CONVERTED CEMR LAB
Cholesterol: 142 mg/dL (ref 0–200)
Total CHOL/HDL Ratio: 2.6
Triglycerides: 96 mg/dL (ref <150)
VLDL: 19 mg/dL (ref 0–40)

## 2009-01-12 ENCOUNTER — Ambulatory Visit: Payer: Self-pay | Admitting: Family Medicine

## 2009-03-15 ENCOUNTER — Ambulatory Visit (HOSPITAL_COMMUNITY): Admission: RE | Admit: 2009-03-15 | Discharge: 2009-03-15 | Payer: Self-pay | Admitting: Obstetrics and Gynecology

## 2009-03-15 ENCOUNTER — Encounter: Payer: Self-pay | Admitting: Obstetrics and Gynecology

## 2009-04-13 ENCOUNTER — Ambulatory Visit: Payer: Self-pay | Admitting: Family Medicine

## 2009-05-25 ENCOUNTER — Ambulatory Visit: Payer: Self-pay | Admitting: Family Medicine

## 2009-05-27 ENCOUNTER — Encounter: Admission: RE | Admit: 2009-05-27 | Discharge: 2009-05-27 | Payer: Self-pay | Admitting: Neurosurgery

## 2009-06-08 ENCOUNTER — Telehealth (INDEPENDENT_AMBULATORY_CARE_PROVIDER_SITE_OTHER): Payer: Self-pay | Admitting: *Deleted

## 2009-06-10 ENCOUNTER — Encounter (INDEPENDENT_AMBULATORY_CARE_PROVIDER_SITE_OTHER): Payer: Self-pay | Admitting: Family Medicine

## 2009-10-19 ENCOUNTER — Ambulatory Visit (HOSPITAL_COMMUNITY): Admission: RE | Admit: 2009-10-19 | Discharge: 2009-10-19 | Payer: Self-pay | Admitting: Internal Medicine

## 2009-11-22 ENCOUNTER — Encounter (HOSPITAL_COMMUNITY): Admission: RE | Admit: 2009-11-22 | Discharge: 2009-12-22 | Payer: Self-pay | Admitting: Internal Medicine

## 2009-11-22 ENCOUNTER — Ambulatory Visit (HOSPITAL_COMMUNITY): Payer: Self-pay | Admitting: Internal Medicine

## 2010-01-11 HISTORY — PX: NM MYOCAR PERF WALL MOTION: HXRAD629

## 2010-06-01 ENCOUNTER — Other Ambulatory Visit: Admission: RE | Admit: 2010-06-01 | Discharge: 2010-06-01 | Payer: Self-pay | Admitting: Obstetrics and Gynecology

## 2010-08-26 ENCOUNTER — Ambulatory Visit (HOSPITAL_COMMUNITY): Admission: RE | Admit: 2010-08-26 | Discharge: 2010-08-26 | Payer: Self-pay | Admitting: Internal Medicine

## 2010-10-23 ENCOUNTER — Encounter: Payer: Self-pay | Admitting: Thoracic Surgery (Cardiothoracic Vascular Surgery)

## 2010-11-01 NOTE — Letter (Signed)
Summary: rpc chart  rpc chart   Imported By: Curtis Sites 07/11/2010 11:34:01  _____________________________________________________________________  External Attachment:    Type:   Image     Comment:   External Document

## 2010-12-23 ENCOUNTER — Other Ambulatory Visit: Payer: Self-pay | Admitting: Obstetrics and Gynecology

## 2010-12-23 ENCOUNTER — Encounter (HOSPITAL_COMMUNITY): Payer: Medicare Other

## 2010-12-23 LAB — URINALYSIS, ROUTINE W REFLEX MICROSCOPIC
Bilirubin Urine: NEGATIVE
Glucose, UA: NEGATIVE mg/dL
Hgb urine dipstick: NEGATIVE
Protein, ur: NEGATIVE mg/dL
Urobilinogen, UA: 0.2 mg/dL (ref 0.0–1.0)

## 2010-12-23 LAB — COMPREHENSIVE METABOLIC PANEL
ALT: 16 U/L (ref 0–35)
Alkaline Phosphatase: 56 U/L (ref 39–117)
BUN: 12 mg/dL (ref 6–23)
CO2: 28 mEq/L (ref 19–32)
GFR calc non Af Amer: >60 mL/min (ref >60)
Glucose, Bld: 77 mg/dL (ref 70–99)
Potassium: 4.2 mEq/L (ref 3.5–5.1)
Total Bilirubin: 0.6 mg/dL (ref 0.3–1.2)
Total Protein: 5.6 g/dL — ABNORMAL LOW (ref 6.0–8.3)

## 2010-12-23 LAB — CBC
HCT: 33.3 % — ABNORMAL LOW (ref 36.0–46.0)
Hemoglobin: 11 g/dL — ABNORMAL LOW (ref 12.0–15.0)
MCV: 91.2 fL (ref 78.0–100.0)
RDW: 13.7 % (ref 11.5–15.5)
WBC: 4.9 10*3/uL (ref 4.0–10.5)

## 2010-12-23 LAB — SURGICAL PCR SCREEN
MRSA, PCR: NEGATIVE
Staphylococcus aureus: POSITIVE — AB

## 2010-12-25 NOTE — H&P (Addendum)
  [name], [name]                 ACCOUNT NO.:  [id number]  MEDICAL RECORD NO.:  [id number]           PATIENT TYPE:  LOCATION:                                FACILITY:  [facility]  PHYSICIAN:  Tilda Burrow, M.D. DATE OF BIRTH:  1924/10/06  DATE OF ADMISSION: DATE OF DISCHARGE:  LH                             HISTORY & PHYSICAL   ADMITTING DIAGNOSES: 1. 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 2. History of vulvar carcinoma in situ. 3. Vaginal fibrosis precluding adequate outpatient exam. 4. New-onset atypia of posterior fourchette.  HISTORY OF PRESENT ILLNESS:  This 77 year old lady, a delightful patient of [facility] for several years has been scheduled for excision of some periurethral tissues that included varicosities consistent with urethral caruncle.  She additionally needs exam under anesthesia to do an excisional biopsy of some hyperkeratosis over the posterior fourchette once again.  Prior GYN history is notable for high-grade lesion taken off on May 30, 2007, showing squamous cell carcinoma in situ.  She had a focus of suspected margin involvement.  She was, therefore, cautiously managed, and in 2010, exam under anesthesia and vulvar biopsies of suspicious areas were performed with totally benign findings.  She was seen in our office, and we have been unable to examine the upper aspects of the surgical margins adequately due to vaginal stenosis.  There is nearly right in the posterior fourchette that shows some hyperplastic changes suggestive of recurrent VIN of undetermined severity.  Plans are to excise the newly developed abnormal area of the vulva and then simultaneously trim some of the vascular redundant tissue beneath the urethra.  The patient is aware of the risk of infection, urinary tract infection, or recurrent bleeding associated with this type of surgery.  Technical aspects of removal have been reviewed with the patient.  PAST  MEDICAL HISTORY:  Positive for hypertension and heart disease.  She is previously followed by Dr. Sande Brothers, and then, current physician is not readily identifiable in the chart.  There is a history of hyper- cholesterolemia.  SURGICAL HISTORY:  Coronary artery bypass graft, three-vessel disease many years ago; C-section x2, cholecystectomy, and hysterectomy.  PHYSICAL EXAMINATION:  GENERAL:  A slim, trim, alert, oriented, very pleasant Caucasian female. VITAL SIGNS:  Height 5 feet 4 inches, weight 120.4, and blood pressure 150/60. HEENT:  Pupils equal, round, and reactive. NECK:  Supple. CARDIOVASCULAR:  Unremarkable. BREASTS:  Normal. ABDOMEN:  Without masses or ascites. EXTERNAL GENITALIA:  Stenotic, atrophic introitus.  Urethral caruncle protruding and noted to be fragile and friable with wiping. Pelvis without suspected masses.  IMPRESSION:  Urethral caruncle, vaginal stenosis, history of vulvar carcinoma in situ with suspected slightly involved margins; had excision in 2008 with negative followup to date.  PLAN:  Vulvar biopsies, excision of urethral caruncle on December 23, 2010.     Tilda Burrow, M.D.     JVF/MEDQ  D:  12/21/2010  T:  12/21/2010  Job:  [id number]  cc:   Renette Butters  Electronically Signed by Christin Bach M.D. on 01/09/2011 05:28:10 PM

## 2010-12-28 ENCOUNTER — Ambulatory Visit (HOSPITAL_COMMUNITY)
Admission: RE | Admit: 2010-12-28 | Discharge: 2010-12-28 | Disposition: A | Discharge status: Home / Self Care | Payer: Medicare Other | Source: Ambulatory Visit | Attending: Obstetrics and Gynecology | Admitting: Obstetrics and Gynecology

## 2010-12-28 ENCOUNTER — Other Ambulatory Visit: Payer: Self-pay | Admitting: Obstetrics and Gynecology

## 2010-12-28 DIAGNOSIS — Z8544 Personal history of malignant neoplasm of other female genital organs: Secondary | ICD-10-CM | POA: Insufficient documentation

## 2010-12-28 DIAGNOSIS — N9089 Other specified noninflammatory disorders of vulva and perineum: Secondary | ICD-10-CM | POA: Insufficient documentation

## 2010-12-28 DIAGNOSIS — N362 Urethral caruncle: Secondary | ICD-10-CM | POA: Insufficient documentation

## 2010-12-28 DIAGNOSIS — I10 Essential (primary) hypertension: Secondary | ICD-10-CM | POA: Insufficient documentation

## 2010-12-28 DIAGNOSIS — Z79899 Other long term (current) drug therapy: Secondary | ICD-10-CM | POA: Insufficient documentation

## 2010-12-28 DIAGNOSIS — Z01812 Encounter for preprocedural laboratory examination: Secondary | ICD-10-CM | POA: Insufficient documentation

## 2010-12-28 DIAGNOSIS — Z951 Presence of aortocoronary bypass graft: Secondary | ICD-10-CM | POA: Insufficient documentation

## 2010-12-28 DIAGNOSIS — Z79890 Hormone replacement therapy (postmenopausal): Secondary | ICD-10-CM | POA: Insufficient documentation

## 2011-01-09 LAB — CBC
HCT: 33.5 % — ABNORMAL LOW (ref 36.0–46.0)
Platelets: 185 10*3/uL (ref 150–400)
RDW: 13.2 % (ref 11.5–15.5)

## 2011-01-09 LAB — COMPREHENSIVE METABOLIC PANEL
Albumin: 3.8 g/dL (ref 3.5–5.2)
Alkaline Phosphatase: 65 U/L (ref 39–117)
BUN: 16 mg/dL (ref 6–23)
GFR calc Af Amer: >60 mL/min (ref >60)
Potassium: 3.8 mEq/L (ref 3.5–5.1)
Sodium: 143 mEq/L (ref 135–145)
Total Protein: 6.1 g/dL (ref 6.0–8.3)

## 2011-01-09 NOTE — Op Note (Signed)
[name], [name]                 ACCOUNT NO.:  [id number]  MEDICAL RECORD NO.:  [id number]           PATIENT TYPE:  O  LOCATION:  DAYP                          FACILITY:  [facility]  PHYSICIAN:  Tilda Burrow, M.D. 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  PROCEDURES: 1. Exam under anesthesia with release of introital stenosis and Z-flap     skin transposition to the introitus. 2. Excision of urethral caruncle.  SURGEON:  Tilda Burrow, MD  ASSISTANT:  None.  ANESTHESIA:  Spinal.  COMPLICATIONS:  None.  FINDINGS: 1. Introital stenosis prior to procedure preventing adequate     monitoring of previous vulvar carcinoma in situ. 2. Urethral caruncle protrude with varicosities resulting in bleeding     when wiping.  DETAILS OF PROCEDURE:  The patient was taken to the operating room, prepped and draped for vaginal procedure after spinal anesthesia introduced.  Time-out was performed, confirmed by involved parties and antibiotics administered.  Perineum was inspected.  The introitus was quite stenotic, not allowing a finger insertion and preventing adequate evaluation of the upper aspects of the previous vulvar biopsies and skin flap transposition.  Decision was made to open up the posterior fourchette.  The previously noted suspicious area in the area of the posterior fourchette appeared normal in appearance once it had been cleansed and adequately accessed.  It was probably just residual epithelium stuck to the skin in that area.  A midline separation of the previous donor skin graft was performed allowing adequate exposure.  A Z-flap transposition was performed from a donor site on the left   gluteus.  A 3 cm long x 1.5 cm wide ellipse of skin and fatty tissue was mobilized from the base of the posterior fourchette out of 45-degree angle, with underlying fatty tissue transferred with the skin donor site.  This was mobilized, shifted into the posterior fourchette and tacked down laterally x5 or 6 sutures of 3- 0 Vicryl.  Epithelium was then tacked down with interrupted sutures along the sides.  Good vascularity was maintained.  We were particularly careful not to do any cautery anywhere along the graft.  Donor site was pulled together with two-layer closure and skin edges approximated with running 3-0 Vicryl.  The graft itself was sutured in place with 3-0 and 4-0 Vicryl.  Urethral caruncle was then addressed by removing approximately 1 cm protruding triangle of skin and underlying varicosities with crossclamping with a hemostat, transecting through the tissue and then pulling the skin edges together.  Sponge and needle counts were correct. One single suture of 4-0 Vicryl was placed to pull the urethral epithelium together.  Foley catheter was inserted to allow for voiding. The patient will be allowed to go home with catheter in place.  Follow up in 48 hours at [facility] for catheter removal and reassessment of the skin flap.  Sponge and needle counts correct.  Tilda Burrow, M.D.     JVF/MEDQ  D:  12/28/2010  T:  12/28/2010  Job:  [id number]  cc:   Catalina Pizza, M.D. Fax: [phone]  Electronically Signed by Christin Bach M.D. on 01/09/2011 05:28:33 PM

## 2011-02-14 NOTE — Consult Note (Signed)
NAMEJAYDAH, [name]                 ACCOUNT NO.:  [id number]   MEDICAL RECORD NO.:  [id number]          PATIENT TYPE:  EMS   LOCATION:  ED                            FACILITY:  [facility]   PHYSICIAN:  Kofi A. Gerilyn Pilgrim, M.D. DATE OF BIRTH:  25-Dec-1924   DATE OF CONSULTATION:  02/18/2008  DATE OF DISCHARGE:  02/18/2008                                 CONSULTATION   REASON FOR CONSULTATION:  Right-sided numbness.   REQUESTING PHYSICIAN:  Rhae Lerner. Margretta Ditty, M.D.   PRIMARY CARE PHYSICIAN:  Franchot Heidelberg, M.D.   The patient is an 77 year old white female who developed right facial  numbness and slurred speech in the middle of the night about 2 days  prior to presentation.  The event lasted for about 10-15 minutes and  resolved.  The patient saw her primary care physician the following day  and had MRI and MRA of the head.  The patient has been on aspirin and  has been compliant on this.  She has taken this for a long time.  She  developed similar symptoms on the day of presentation and decided to  come to the emergency room for further evaluation.  Her MRI of the brain  and MRA were reported as normal.  The patient indicated she has been  having some palpitation and fluttering and, in fact, has an event  monitor.  It is unclear if her two neurologic events are associated with  palpitations.  She has had an echo a couple of months ago and reported  that this has been negative.  She sees Dr. Kem Boroughs.   PAST MEDICAL HISTORY:  Significant for hypertension.   PAST SURGICAL HISTORY:  Cesarean section, hysterectomy and triple  coronary artery bypass grafting.   SOCIAL HISTORY:  No tobacco, alcohol or illicit drug use.   ALLERGIES:  HIGH-DOSE ASPIRIN and SULFA MEDICATION.   MEDICATIONS:  Metoprolol, lisinopril, lovastatin.   REVIEW OF SYSTEMS:  No headaches are reported.  No neck pain.  Otherwise, the review of systems is negative other than from the history  of present illness  obtained from Dr. Margretta Ditty.  There is a history of  cancer of probably the vaginal area.  It is unclear if she had surgery  for this or not.   PHYSICAL EXAMINATION:  On presentation to the emergency room the patient  was hypertensive with blood pressure systolic of 200, diastolic of 75.  She essentially has had isolated systolic hypertension throughout.  Her  blood pressure on evaluation, however, was 160 systolic over 60  diastolic.  Respirations 20.  Pulse 60.  HEENT:  Neck is supple.  Head is normocephalic, atraumatic.  ABDOMEN:  Soft.  EXTREMITIES:  No significant edema.  Mentation:  The patient is awake and alert.  She converses well.  Speech, language and cognition are intact.  Cranial nerve evaluation:  Her pupils equally round and reactive to light and accommodation.  The  visual fields are intact.  Extraocular movements are intact.  Facial  muscle strength is symmetric.  Tongue is midline.  Uvula midline.  Motor  examination:  I see no pronator drift.  Essentially bulk and tone is  normal.  Strength is also normal in both upper and lower extremities.  Coordination shows no dysmetria, tremors or past-pointing.  Reflexes are  normal with downgoing plantar reflexes.  Sensation by this point is  normal to light touch and temperature.   MRI of the brain was reviewed and shows no acute findings on diffusion  imaging.  There are some mild chronic changes.  MRA of the brain and  extracranial carotids shows no occlusive disease.   IMPRESSION:  1. Likely transient ischemic attack of a small to moderate-sized      vessel.  The major intracranial and extracranial cerebral      vasculature is patent and does not show occlusive disease.  2. Hypertension.  3. Dyslipidemia.  4. Coronary artery disease.   RECOMMENDATIONS:  We suggest that the patient take Plavix with aspirin  for the next 2-3 months.  She is to contact our office if she has any  further neurologic events.  She should  continue with blood pressure  control.  She is to follow up with [place] in a couple of weeks to see how  things are going.  It would be interesting to find out the results of  the event monitor which she is currently wearing and see if there is any  correlation to the events that she had.  At the end of 3 months she  probably should discontinue the aspirin and continue with Plavix.   Thank you for this consultation.      Kofi A. Gerilyn Pilgrim, M.D.  Electronically Signed     KAD/MEDQ  D:  02/20/2008  T:  02/20/2008  Job:  [id number]   cc:   Franchot Heidelberg, M.D.

## 2011-02-14 NOTE — Op Note (Signed)
NAMERENNAE, FERRAIOLO                 ACCOUNT NO.:  [id number]   MEDICAL RECORD NO.:  [id number]          PATIENT TYPE:  OBV   LOCATION:  A330                          FACILITY:  [facility]   PHYSICIAN:  Tilda Burrow, M.D. DATE OF BIRTH:  07/09/1925   DATE OF PROCEDURE:  05/30/2007  DATE OF DISCHARGE:                               OPERATIVE REPORT   PREOPERATIVE DIAGNOSIS:  Vaginal carcinoma in situ.   POSTOPERATIVE DIAGNOSIS:  Vaginal carcinoma in situ.   PROCEDURE:  Wide local excision of vaginal tumor (partial vaginectomy,  bilateral vulvar flap Z plasty).   SURGEON:  Tilda Burrow, M.D.   ASSISTANT:  Richardean Sale, M.D.   ANESTHESIA:  General.   COMPLICATIONS:  None.   FINDINGS:  A 5-cm x 4-cm area of verrucous tumor covering the lower  fourth of the vagina from just inside the posterior fourchette,  extending up the right vaginal side wall to the 9 o'clock position,  requiring removal of a 5-cm x 4-cm x 1-cm specimen.   DETAILS OF PROCEDURE:  The patient was taken to the operating room,  prepped and draped. Spinal anesthesia introduced. High lithotomy leg  support using yellow-fin leg supports performed. Perineum was prepped  and draped. Vaginal area was inspected thoroughly with patient able to  tolerate full exam due to the effective analgesia. This revealed a more  extensive superficial lesion than was appreciable in the office which  extended all of the way from 5 o'clock just inside the introitus, just  behind the posterior fourchette, circling obliquely up to 9 o'clock on  the right vaginal side wall. The verrucous lesion was superficial in  appearance and did show any suspected deep lesion. The inguinal nodes  were palpably normal.   A 2- to 3-mm periphery of the specimen was removed with the specimen,  beginning at the posterior fourchette and dissecting full-thickness  specimen, removing an approximately 5-cm x 4-cm ellipse of skin and  underlying fatty  tissue. Point cautery was used as necessary to control  hemostasis. There were individual bleeders along the vaginal edges that  required point cautery. No major arteries were encountered. Specimen was  dissected, and we were very careful to ensure that the edges were clean.  At the end of the procedure, a large defect existed from approximately 5  o'clock on the left to 9 o'clock on the right, at least 4 cm in overall  length and slightly wider. The perineal tissues were sufficiently lax,  but some undermining of tissues was possible. Additionally, the vagina  could be mobilized for a distance of approximately a centimeter above it  so that the tissue would retract inferior. At no time was there any  suspicion of injury to bowel. The defect was sufficiently large.  Obviously, it would not be possible to do a primary closure, so after  some assessment, decision was made to do bilateral vulvar flap Z plasty.  The inferior aspects of the left labia majora were sufficiently relaxed  to allow extending the left side of the defect in an elliptical fashion,  allowing [place]  to mobilize a 3-cm long x 2-cm wide x 1-cm deep portion of  the left labia majora. This specimen was mobilized and rotated into the  defect and sutured around this periphery using 2-0 chromic. This covered  the left side down to the midline of the vagina. There was some diffuse  oozing from the vaginal apex, but point cautery was used when practical  and some oozing tolerated. The right side required a more extensive  dissection, and a larger 3 x 3-cm portion of the right labia majora was  mobilized in flap technique, rotated clockwise into the introitus and  stitched in place. The two flap Z plasties were stitched to each other  in the midline. The donor sites were then reapproximated side to side  with some mobilization of the lateral aspects of the donor site, the  skin edges pulled together, leaving the donor site not  extending past  the urethra on either side but resulting in good tissue edge  reapproximation. The flaps had good color at the end of the procedure  and had a thick fatty pad to assist with blood supply as well as a broad  pedicle from which they originated. The patient did have some oozing  from the upper aspects of the incision, along the upper aspects of the  vagina specimen site, and it was necessary to place a vaginal pack over  this area which will be left in place for 30 minutes while vaginal pack  in place and ice pack in place. The patient tolerated the procedure  well, was completely comfortable throughout due to excellent spinal  analgesia, went to the recovery room. She will be observed overnight  with discharge in a.m.      Tilda Burrow, M.D.  Electronically Signed     JVF/MEDQ  D:  05/30/2007  T:  05/31/2007  Job:  [id number]

## 2011-02-14 NOTE — H&P (Signed)
NAME:  [name], [name]                 ACCOUNT NO.:  [id number]   MEDICAL RECORD NO.:  [id number]          PATIENT TYPE:  AMB   LOCATION:  DAY                           FACILITY:  [facility]   PHYSICIAN:  Tilda Burrow, M.D. 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   HISTORY OF PRESENT ILLNESS:  This 77 year old female who is now 22  months status post wide local excision of vaginal tumor, partial  vaginectomy with bilateral vulvar Z-flap cosmetic repair.  She has  vaginal stenosis, status post this resection and flap revision.  She is  not sexually active.   Unfortunately, the pathology report from 2 years ago raised the question  of involvement of the margins of the tumor specimen.  Clinically, the  margins appeared clear, but the microscopic assessment raised the  question of focal margin involvement.  We have not seen any skin changes  within the 2 years of annual exams, but unfortunately the access to the  upper aspects of the flap transposition is very limited, and we need to  get a more thorough evaluation.  She was therefore going to be taken to  the operating room for an exam under anesthesia and probable vulvar  biopsies at the edge of her prior skin flap transposition.   PAST MEDICAL HISTORY:  Managed by Dr. Franchot Heidelberg.  Notable for  hypertension, hyperlipidemia, malaise, fatigue, cervical disk disease,  degenerative disease of the back and neck.   MEDICAL HISTORY:  Positive for anemia, GERD, hypertension,  hyperlipidemia, and osteoporosis.   SURGICAL HISTORY:  Coronary artery bypass graft, cesarean section x2,  cholecystectomy, and hysterectomy.   CURRENT MEDICATIONS:  Current list includes the following as of Mar 01, 2009:  1. Lovastatin 20 mg 1 tablet daily.  2.  Lisinopril 20 mg 1 tablet daily.  3. Metoprolol 50 mg 1 tablet daily.  4. Ranitidine 150 mg 1 tablet twice daily.  5. Tramadol 50 mg 1 every 6 hours p.r.n. pain.  6. Relpax 20 mg 1 tablet at onset of headache, repeat in 2 hours up to      2 tablets daily.  7. Aspirin 81 mg twice daily.  8. Multivitamins twice daily.  9. Stool softener twice daily.  10.FiberChoice twice daily.  11.MiraLax stool softener p.r.n. constipation.  12.Flaxseed oil 1000 mg once daily.  13.Vitamin C 1000 mg once daily.  14.Calcium 600 mg with vitamin D once daily.  15.Vitamin E 1000 international units once daily.  16.Fish oil 1000 mg with 300 mg of total omega-3 once daily.  17.Reclast shot for osteoporosis once daily.   PHYSICAL EXAMINATION:  GENERAL:  A petite, alert, oriented Caucasian  female.  VITAL SIGNS:  Weight 121.8, blood pressure 130/80.  EYES:  Pupils equal, round, and reactive.  NECK:  Supple.  CHEST:  Clear.  ABDOMEN:  Nontender.  EXTERNAL GENITALIA:  Symmetric without any frank  lesions.  There is  evidence of prior vulvar flap transposition into the near narrow vaginal  introitus.  I can barely visualize the area at the edge of the donor  site.   IMPRESSION:  History of vulvar cancer with questionably involved margins  scheduled for exam under anesthesia and biopsies as needed.  I will plan  followup on March 15, 2009.      Tilda Burrow, M.D.  Electronically Signed     JVF/MEDQ  D:  03/12/2009  T:  03/13/2009  Job:  [id number]   cc:   Franchot Heidelberg, M.D.

## 2011-02-14 NOTE — Op Note (Signed)
NAMEGYPSY, [name]                 ACCOUNT NO.:  [id number]   MEDICAL RECORD NO.:  [id number]          PATIENT TYPE:  AMB   LOCATION:  DAY                           FACILITY:  [facility]   PHYSICIAN:  Tilda Burrow, M.D. DATE OF BIRTH:  [birth year]   DATE OF PROCEDURE:  03/15/2009  DATE OF DISCHARGE:                               OPERATIVE REPORT   PREOPERATIVE DIAGNOSES:  1. History of vulvar carcinoma in situ.  2. Vaginal fibrosis precluding adequate exam as outpatient.   POSTOPERATIVE DIAGNOSES:  1. History of vulvar carcinoma in situ.  2. Vaginal fibrosis precluding adequate exam as outpatient.   PROCEDURES:  1. Exam under anesthesia.  2. Vulvar biopsies at 6 o'clock and 3 o'clock.   SURGEON:  Tilda Burrow, MD   ASSISTANT:  None.   ANESTHESIA:  Local with monitored anesthesia care.   COMPLICATIONS:  None.   FINDINGS:  Fibrosis in the posterior fourchette felt to represent old  surgical changes but preventing adequate outpatient exam, normal  atrophic vaginal mucosa, no areas of suspicious tissue, confirmatory  biopsies performed.   INDICATIONS:  An 77 year old female status post bilateral partial  vulvectomy with a history of vulvar carcinoma in situ with questionable  margin involvement from 2008, scheduled for exam under anesthesia.  She  could not tolerate adequate abduction of vaginal sidewalls to adequately  inspect the apex of the previous biopsies and skin flap transposition.   DETAILS OF PROCEDURE:  The patient was taken to the operating room.  Monitored anesthesia care initiated and local anesthesia injected.  The  patient tolerated the procedure well.  Labia could be abducted enough  that the fibrosis along the posterior fourchette could be identified.  Biopsy was performed at the site of greatest stricture in order to take  out the old scar and obtain a small portion of tissue on either side of  the scar, biopsy specimen is about 1 cm in maximum length.   The specimen  was sent for histology.  A second biopsy site was performed at 3 o'clock  and involved removal with similar tissue just at the vaginal mucosa  margin, immediately above the old skin flap transposition.  The tissue  was grossly normal.  The biopsy was performed to obtain a representative  sample tissue.  Posterior fourchette had some skin cracking from the  retraction efforts but there was no suspicious lesions anywhere  along the external surface.  The patient was then allowed to awake and  go to recovery room in stable condition.  Silver nitrate was applied to  a small area with continued ooze at 6 o'clock.  The patient will be  discharged home given topical lidocaine briefly and topical estrogen  cream to be used for 6 weeks to promote improved healing.      Tilda Burrow, M.D.  Electronically Signed     JVF/MEDQ  D:  03/15/2009  T:  03/16/2009  Job:  [id number]   cc:   [facility]

## 2011-02-14 NOTE — H&P (Signed)
[name], [name]                 ACCOUNT NO.:  [id number]   MEDICAL RECORD NO.:  [id number]          PATIENT TYPE:  OBV   LOCATION:  A330                          FACILITY:  [facility]   PHYSICIAN:  Tilda Burrow, M.D. DATE OF BIRTH:  June 05, 1925   DATE OF ADMISSION:  05/30/2007  DATE OF DISCHARGE:  08/29/2008LH                              HISTORY & PHYSICAL   ADMISSION DIAGNOSIS:  Vaginal carcinoma in situ lesion, scheduled for  wide local excision.   HISTORY OF PRESENT ILLNESS:  This 77 year old female has been seen in  our office courtesy of Dr. Erby Pian,  who performed a Pap smear on her  which showed high-grade lesion suggestive of CIN or VIN-III.  There were  features present suspicious for squamous cell carcinoma.  Tissue studies  were recommended.  This was performed in April.  She has been examined  in our office and at most recent exam we found a relatively harmless-  looking, fleshy lesion just inside the introitus on the left side, which  on cervical biopsy is interpreted as severe dysplasia/carcinoma in situ.  There are no invasive characteristics seen on the large biopsy specimen  taken.  Complete excision is recommended and planned.  The lesion is  very innocuous and to palpation feels like it is sitting on the surface.  There is no surrounding induration or thickening of the tissue.  It  rests just inside the hymen remnants on the patient's right side at  approximately 5 o'clock and is easily be obscured by speculum exam.  It  had slight fragility and bleeds somewhat with repeated speculum exam,  thus alerting [place] to its location.  The patient is aware that this lesion  may indeed turn out to have invasive characteristics.  The plan is to  proceed with wide local and deep local excision so that we can fully  assess the specimen and decide further therapy if necessary.  The  patient is aware that if invasive characteristics are identified that a  subsequent lymph node  biopsy may be necessary , or additional surgeries.   PAST MEDICAL HISTORY:  Positive for heart disease and hypertension.  She  sees Dr. Erby Pian for these problems, has seen cardiology regarding  these problems as well.  She is also followed for hypercholesterolemia.   SURGICAL HISTORY:  She had a coronary bypass graft, three-vessel.  She  had C-section x2, cholecystectomy and hysterectomy, all many years ago.   General exam shows a slim, wiry, completely alert and oriented Caucasian  female.  Height 5 feet 4 inches, weight 121, blood pressure 100/60.  Pupils equal, round and reactive.  NECK:  Supple.  CHEST:  Clear to auscultation.  ABDOMEN:  Nontender without masses.  Inguinal area without adenopathy.  External genitalia:  Normal, atrophic, postmenopausal tissues.  Speculum  exam shows atrophic vaginal tissues with normal, well-healed vaginal  cuff support.  Rotation of the speculum to better assess the vaginal  walls reveals a small lesion approximately 2 cm in maximum exam width by  1 cm depth that appears superficial.  The lesion is somewhat  fragile and  biopsy reveals carcinoma in situ/severe dysplasia.  Palpation reveals it  is a discrete lesion without any thickening or induration of the  underlying tissue.   IMPRESSION:  Vaginal carcinoma in situ.   PLAN:  Wide excision of vaginal lesion with primary closure of the  specimen pulse to be performed May 31, 2007.      Tilda Burrow, M.D.  Electronically Signed     JVF/MEDQ  D:  05/28/2007  T:  05/29/2007  Job:  [id number]   cc:   Franchot Heidelberg, M.D.

## 2011-02-17 NOTE — H&P (Signed)
NAMEMarland Kitchen  [name], [name] NO.:  [id number]   MEDICAL RECORD NO.:  [id number]          PATIENT TYPE:  EMS   LOCATION:  MAJO                         FACILITY:  [facility]   PHYSICIAN:  Darlin Priestly, MD  DATE OF BIRTH:  November 05, 1924   DATE OF ADMISSION:  10/27/2005  DATE OF DISCHARGE:                                HISTORY & PHYSICAL   REASON FOR ADMISSION:  Patient was undergoing diagnostic heart  catheterization at [facility].  Was found to have 95% left main  disease and was transferred to [facility] for further management.   HISTORY OF PRESENT ILLNESS:  A 77 year old white female with past medical  history of headaches, hypertension, hyperlipidemia, reflux disease, and  osteoarthritis as well as coronary disease.  Was seen by Dr. Domingo Sep  initially October 05, 2005 to establish cardiology follow-up.  She did  complain of chest pain exertional in nature when walking uphill for the past  year.  She also had had similar symptoms four years ago when she underwent  her second cardiac catheterization with Dr. Adonis Housekeeper at [facility].  She was told at that time she had a blockage on the back part of her heart.  Patient reports that the chest discomfort is always exertional when walking  up the hill or up steps.  It is located to the mid upper chest area and no  radiation of the discomfort.  It is associated with shortness of breath, but  no nausea, vomiting, or diaphoresis.  Resolve promptly with rest and appears  unchanged in frequency, severity, or duration over the past year.   She also has independent complaints of shortness of breath and dyspnea on  exertion, though she walks approximately one mile a day.  No palpitations,  dizziness unless she is having a migraine.  Her migraines are not headache  pain, but blurred vision.   Patient denies lower extremity edema, PND, or orthopnea.  She does state her  legs give out when she is walking, but denies  specific discomfort.  She  stopped exercising about six months ago when she had the exertional chest  discomfort/shortness of breath.   Cardiac risk factors:  Coronary disease, hypertension, hyperlipidemia,  strong family history of coronary disease.   PAST MEDICAL HISTORY:  1.  Migraine headaches presenting as only blurred vision, no pain.  2.  Hypertension.  3.  Hyperlipidemia.  4.  Coronary disease.  5.  Gastroesophageal reflux disease.  6.  Osteoarthritis.   PAST SURGICAL HISTORY:  1.  Partial hysterectomy.  2.  Cesarean section x2.  3.  Remote tonsillectomy.  4.  Bilateral cataract surgery.   ALLERGIES:  SULFA and ASPIRIN.  The aspirin only if she takes it in large  doses, but tolerates 81 mg daily without problems.   OUTPATIENT MEDICATIONS:  1.  Zomig 5 mg p.r.n. which we will hold as she has 95% left main disease.  2.  Imdur 60 mg daily.  3.  Atenolol 50 daily.  4.  Triazolam 0.125 q.h.s.  5.  Caduet 5/10 q.h.s.  6.  Quinine  5 mg q.h.s.  7.  Lisinopril/hydrochlorothiazide 10/12.5 daily.  8.  Sulindac 150 mg one to two daily p.r.n.  9.  ___________ Tylenol 100 mg q.h.s. p.r.n.  10. Stool softener daily.  11. Nitroglycerin sublingual p.r.n.  12. Multivitamin daily.  13. Prilosec over-the-counter daily.  14. Fish oil 1000 mg daily.  15. Vitamin E 400 international units daily.  16. Glucosamine 1000 mg daily.  17. Loratadine 10 mg daily.   SOCIAL HISTORY:  Negative for tobacco, alcohol, or illicit drugs.  She is  married.  She has two children.  She works as a Solicitor at [employer].   FAMILY HISTORY:  Positive for coronary artery disease, also lung cancer,  COPD, and renal cell carcinoma in two siblings.  Diabetes is prevalent and  positive in one sibling but this patient does not have diabetes.  Coronary  disease is quite strong throughout her family.   REVIEW OF SYSTEMS:  GENERAL:  No recent colds or fevers.  Essentially  negative musculoskeletal.   Significant osteoarthritis, treated.  SKIN:  No  rashes or ulcers.  HEENT:  Eyes:  She wears glasses and has had cataract  surgery previously.  She does experience blurred vision with her migraines  and no headache pain.  PULMONARY/CARDIOVASCULAR:  See HPI.  GI:  Notable for  indigestion, occasional diarrhea and constipation.  No blood in her stool.  GU:  Negative for hematuria or dysuria.  ENDOCRINE:  No diabetes or thyroid  disease.  NEURO:  History of migraine headaches.  ENT:  Year-round sinus  drainage managed over-the-counter nasal saline spray.  PSYCH:  No  depression.  Essentially negative psych history.  VASCULAR:  As stated  previously.  HEME:  No awareness of anemia.   PHYSICAL EXAMINATION:  VITAL SIGNS:  Blood pressure 109/65, pulse 73,  respirations 18, temperature 97.9, oxygen saturation room air 97%.  GENERAL:  Alert and oriented x3.  Moves all extremities.  SKIN:  Warm and dry.  Brisk capillary refill.  HEENT:  Sclerae clear.  NECK:  Supple.  No JVD.  No bruits.  HEART:  S1, S2.  Regular rate and rhythm without murmur, gallop, rub, or  click.  PMI is not displaced.  There is no heave or lift.  Cardiac upstroke  is normal, 2+ bilaterally without delay.  ABDOMEN:  Soft, nontender.  Positive bowel sounds.  Do not palpate liver,  spleen, or masses.  EXTREMITIES:  Lower extremities without edema.  Pedal pulses are 2+  bilaterally.  She has a well healed midline scar in her abdomen.   EKG:  Sinus rhythm without any acute changes, though some nonspecific ST  changes inferiorly and anterolaterally, but no change from previous tracing.  I do not have laboratories from today but for her cardiac catheterization  her laboratories and EKG as stated and it is sinus.  She has had ABIs done  on October 24, 2005 right and left ABIs.  No evidence of arterial insufficiency to the lower extremities at rest, PVRs pulsatile flow, no  evidence of diameter reduction.  Glucose 91, BUN 13,  creatinine 1, sodium  142, potassium 4.1, chloride 107, CO2 25, calcium 9.1, total protein 6.3.  LFTs normal.  TSH 1.15.  Hemoglobin 14.7, hematocrit 43.2, platelets 253,  WBC 6.7.   IMPRESSION:  1.  Angina.  2.  Critical coronary disease with 95% left main disease as well as 70%      right coronary artery and 70% left anterior descending disease.  3.  Hypertension currently controlled.  4.  Hyperlipidemia, treated.  5.  Gastroesophageal reflux disease, treated.  6.  Osteoarthritis.   PLAN:  Will repeat laboratory work now.  She will be admitted to a CCU bed  on nitroglycerin, IV heparin, low-dose aspirin and CVTS will see her for  evaluation for bypass grafting.      Darcella Gasman. Valarie Merino      Darlin Priestly, MD  Electronically Signed    LRI/MEDQ  D:  10/27/2005  T:  10/27/2005  Job:  [id number]   cc:   Dorthula Rue. Early Chars, MD  Fax: [phone]   Salvatore Decent. Dorris Fetch, M.D.  [address]  [place]  [place] [address]

## 2011-02-17 NOTE — Consult Note (Signed)
[name], [name]                 ACCOUNT NO.:  [id number]   MEDICAL RECORD NO.:  [id number]          PATIENT TYPE:  INP   LOCATION:  2309                         FACILITY:  [facility]   PHYSICIAN:  Salvatore Decent. Dorris Fetch, M.D.DATE OF BIRTH:  1925-08-25   DATE OF CONSULTATION:  10/27/2005  DATE OF DISCHARGE:                                   CONSULTATION   REASON FOR CONSULTATION:  Left main disease.   HISTORY OF PRESENT ILLNESS:  [name] is a 77 year old female who does have  a history of mild coronary artery disease previously untreated who, around  Thanksgiving, began having significant chest pain and shortness of breath  with exertion.  She described this as a tightness in her chest which  radiates to her neck and arm, and is associated with shortness of breath.  She has not had any episodes at rest or nocturnal episodes.  She still works  and she walks quite a bit during the day.  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  Recently,  she was walking about a mild a day prior to Thanksgiving.  She states that  she has become weak and tired and does not have much energy, as well.  She  underwent a Persantine Cardiolite which showed normal LV function but she  did have stress induced anterior wall ischemia and chest discomfort.  She,  today, underwent cardiac catheterization and was found to have a 90% left  main stenosis as well as a 70% stenosis in the right coronary artery.  Ejection fraction by catheterization was approximately 50%.  She did have  some mild apical hypokinesis.   PAST MEDICAL HISTORY:  Significant for coronary artery disease,  hypertension, hyperlipidemia, gastroesophageal reflux, migraine headaches,  and osteoarthritis.   PAST SURGICAL HISTORY:  Significant for partial hysterectomy, cesarean  section x 2, remote tonsillectomy, bilateral cataract surgery.   CURRENT MEDICATIONS:  Zomig 5 mg p.o. p.r.n., Imdur 60  mg daily, Atenolol 50  mg daily, Triazolam 0.125 mg q.h.s., Caduet 5/10 mg 1 tablet q.h.s., quinine  5 mg q.h.s., Lisinopril/hydrochlorothiazide 10/12.5 1 tablet daily, Zondek  150 mg 1-2 tablets daily p.r.n., propafenone/Darvocet N100, 100 mg q.h.s.  p.r.n.  She takes Prilosec over the counter.  Fish oil 1000 mg daily,  vitamin E, glucosamine, and Loratadine 10 mg daily.   ALLERGIES:  Sulfa and has problems with large doses of aspirin, but  tolerates a baby aspirin daily.   FAMILY HISTORY:  Notable for coronary artery disease and COPD.   SOCIAL HISTORY:  She works as a Solicitor in [employer], she does  not use alcohol or tobacco.   REVIEW OF SYMPTOMS:  She does have degenerative joint disease with arthritis  pain, dry skin, she has migraine headaches which cause blurred vision.  She  does have some occasional indigestion and diarrhea but no recent dramatic  changes.  She does have some sinus drainage and allergies.  All other  systems are negative.   PHYSICAL EXAMINATION:  GENERAL:  [name] is an  alert, pleasant 77 year old white female in no  acute distress.  She is well developed and well nourished.  NEUROLOGICAL:  Alert and oriented x 3, appropriate, and grossly intact.  HEENT:  She is wearing glasses, otherwise unremarkable.  NECK:  Supple without thyromegaly, adenopathy, or bruits.  CARDIAC:  Regular rate and rhythm, normal S1 and S2, no murmurs, gallops,  and rubs.  LUNGS:  Clear with equal breath sounds, no wheezing.  ABDOMEN:  Soft, nontender.  EXTREMITIES:  No cyanosis, clubbing, and edema.  I am not able to palpate  pulses distally.  She has a 2+ left radial and 1+ right radial.   LABORATORY DATA:  ABIs were 1.0 on both sides.  White count 5.8, hematocrit  31, platelets 194.  Protime 13.6, PTT 26.  BUN 25, creatinine 1.1, the  remainder of her electrolytes are within normal limits.  Chest x-ray showed  mild chronic interstitial changes.   IMPRESSION:  [name] is a 77 year old lady who presents with exertional  chest discomfort.  At catheterization, she has a 95% left main stenosis.  Coronary artery bypass grafting is indicated for survival benefit and relief  of symptoms.  I have discussed in detail with the patient and her family the  nature and extent of the surgery including the incisions to be used, the  need for cardiopulmonary bypass, and the need to graft two sites on the left  and one site on the right.  They understand the risks of surgery include but  are not limited to death, stroke, MI, DVT, PE, bleeding, possible need for  transfusions, infection, as well as other organ dysfunction including  respiratory, renal, or GI complications.  She has two family members who  have had coronary bypass grafting and they understand the nature and extent  of the operation.   We will schedule her for urgent CABG Saturday morning, January 27.           ______________________________  Salvatore Decent. Dorris Fetch, M.D.     SCH/MEDQ  D:  10/27/2005  T:  10/28/2005  Job:  [id number]   cc:   Darlin Priestly, MD  Fax: [phone]   Dani Gobble, MD  Fax: [phone]   Dorthula Rue. Early Chars, MD  Fax: [phone]

## 2011-02-17 NOTE — Procedures (Signed)
[name], [name]                 ACCOUNT NO.:  [id number]   MEDICAL RECORD NO.:  [id number]          PATIENT TYPE:  OUT   LOCATION:  RAD                           FACILITY:  [facility]   PHYSICIAN:  Dani Gobble, MD       DATE OF BIRTH:  Mar 14, 1925   DATE OF PROCEDURE:  12/01/2005  DATE OF DISCHARGE:                                  ECHOCARDIOGRAM   REFERRING PHYSICIAN:  Dorthula Rue. Early Chars, M.D./Ann Domingo Sep, M.D.   INDICATIONS:  A 77 year old female with CAD, status post recent CABG who was  referred for evaluation of LV function.   The technical quality of the study is adequate.   M-MODE TRACINGS:  The aorta measures normally at 2.4 cm.   Left atrium also measures normally at 3.6 cm.   The interventricular septum and posterior wall are within normal limits at  0.9 cm and 0.7 cm respectively.   The aortic valve is trileaflet with normal leaflet excursion.  No  significant aortic insufficiency is noted.  Doppler interrogation of the  aortic valve was within normal limits.   Mitral valve also appears structurally normal.  No mitral valve prolapse is  noted.  Mild mitral regurgitation is noted.  Doppler interrogation of mitral  valve is within normal limits.  There is no suggestion of diastolic  dysfunction on this study.   The pulmonic valve is not well-visualized.   Tricuspid valve appears grossly structurally normal with mild tricuspid  regurgitation noted.  There is no suggestion of pulmonary hypertension on  this study.   Left ventricle is normal in size with LVIDD measured at 3.9 cm and LVISD  measured at 2.8 cm.  Overall left ventricular systolic function is normal.  There is mild paradoxical septal motion which likely secondary to  thoracotomy.  Overall ejection fraction is normal.   The right ventricle appears mildly generous with a subtle suggestion of mild  decrease in right ventricular systolic function.  Right atrium may be just  mildly dilated or at the upper limits  of normal.  There is a moderate to  large left pleural effusion noted.   IMPRESSION:  1.  Mild mitral and tricuspid regurgitation.  2.  Normal left ventricular size and systolic function with a normal      ejection fraction.  3.  The right ventricle appears mildly generous with a subtle suggestion of      a mildly decreased right ventricular      systolic function.  4.  Borderline dilated right atrium.  5.  Moderate to large left pleural effusion.           ______________________________  Dani Gobble, MD     AB/MEDQ  D:  12/01/2005  T:  12/01/2005  Job:  [id number]   cc:   Dorthula Rue. Early Chars, MD  Fax: [phone]

## 2011-02-17 NOTE — Discharge Summary (Signed)
[name], [name]                 ACCOUNT NO.:  [id number]   MEDICAL RECORD NO.:  [id number]          PATIENT TYPE:  INP   LOCATION:  2009                         FACILITY:  [facility]   PHYSICIAN:  Salvatore Decent. Dorris Fetch, M.D.DATE OF BIRTH:  16-Mar-1925   DATE OF ADMISSION:  10/27/2005  DATE OF DISCHARGE:  11/02/2005                                 DISCHARGE SUMMARY   HISTORY OF PRESENT ILLNESS:  The patient is a 77 year old white female with  a past medical history including chronic headaches, hypertension,  hyperlipidemia, gastroesophageal reflux, osteoarthritis, and coronary artery  disease.  She was seen by Dr. Domingo Sep, on October 05, 2005, to establish  cardiology followup.  She did complain of chest pain that was exertional in  nature, when walking uphill for the past approximately one year.  She  apparently had similar symptoms approximately four years ago, when she  underwent her second cardiac catheterization with Dr. Adonis Housekeeper at [facility].  She was told at that time she had a blockage on the back part  of her heart.  The patient reported that the chest pain discomfort is always  exertional when walking uphill or steps.  The patient would be located in  the mid upper chest area with no radiation of the discomfort.  It was  associated with shortness of breath but no nausea, vomiting, or diaphoresis.  The symptoms would resolve promptly with rest and appear unchanged in  frequency, severity, or duration for the past year.  She also had  independent complaints of shortness of breath and dyspnea on exertion,  though she walks approximately one mile each day.  She denied palpitations  or dizziness unless she was having a migraine.  Her migraines are described  as not headache pain but mostly blurred vision.  She denied lower extremity  edema, paroxysmal nocturnal dyspnea, or orthopnea.  She did state that her  legs would give out when she was walking but denied specific  discomfort.  She stopped exercising about six months ago when she had the exertional  chest discomfort/shortness of breath.  She was scheduled for cardiac  catheterization as an outpatient at the [facility] and was  found to have a 95% left main stenosis and was transferred to [facility], this hospitalization, for further evaluation and treatment  including cardiovascular and thoracic surgical consultation.   PAST MEDICAL HISTORY:  1.  Migraine headaches, presenting as blurred vision, no pain.  2.  Hypertension.  3.  Hyperlipidemia.  4.  Coronary disease.  5.  Gastroesophageal reflux disease.  6.  Osteoarthritis.   PAST SURGICAL HISTORY:  1.  Partial hysterectomy.  2.  Cesarean section x2.  3.  Remote tonsillectomy.  4.  Bilateral cataract surgery.   ALLERGIES:  1.  SULFA.  2.  ASPIRIN.  But apparently she is able to take very low doses of aspirin      without problem.   OUTPATIENT MEDICATIONS:  1.  Zomig 5 mg p.r.n.  2.  Imdur 60 mg daily.  3.  Atenolol 50 mg daily.  4.  Triazolam 0.125 mg q.h.s.  5.  Caduet 5/10 one q.h.s.  6.  Quinine 5 mg q.h.s.  7.  Lisinopril/hydrochlorothiazide 10/12.5 daily.  8.  Sulindac 150 mg one or two daily p.r.n.  9.  Tylenol p.r.n.  10. Stool softener daily.  11. Nitroglycerin sublingual p.r.n.  12. Multivitamin daily.  13. Prilosec over the counter.  14. Fish oil 1,000 mg daily.  15. Vitamin 400 international units daily.  16. Glucosamine 1,000 mg daily.  17. Loratadine 10 mg daily.   Social history, family history, review of symptoms, and physical exam,  please see  history and physical done at the time of admission.   HOSPITAL COURSE:  The patient was admitted to the [facility] cardiology service with critical left main disease.  She was  placed in the coronary care unit, started on intravenous heparin,  intravenous nitroglycerin, and a CVTS consultation was obtained with the   Charlett Lango, M.D. who evaluated the patient and studies and  recommended to proceed with surgical revascularization which was scheduled  for the following day.   PROCEDURE:  On October 28, 2005, the patient underwent the following  procedure, coronary artery bypass grafting x3.  The following grafts were  placed:  1.  Left internal mammary artery to the LAD.  2.  Saphenous vein  graft to the right coronary.  3.  Saphenous vein graft to the obtuse  marginal.  The patient tolerated the procedure well and was taken to the  surgical intensive care unit in stable condition.   POSTOPERATIVE HOSPITAL COURSE:  The patient has done well.  She has  maintained a normal sinus rhythm and stable hemodynamics.  She was weaned  from the ventilator without difficulty.  She is neurologically intact.  Laboratory values are quite stable with normal BUN creatinine.  She does not  have a postoperative anemia.  Most recent hemoglobin and hematocrit, dated  October 31, 2005, are 12.9 and 37.4 respectively.  She has responded well to  a gentle diuresis.  All routine lines, monitors, and drainage devices have  been discontinued in the standard fashion.  She is advancing in activity  using routine cardiac rehabilitation phase I protocols.  She is improving in  regard to pulmonary toilet and oxygen has been weaned, maintaining good  saturations on room air.  Incisions are healing well without signs of  infection.  She is overall felt to be quite stable for tentative discharge  in the morning of November 02, 2005, pending morning round re-evaluation.   DISCHARGE MEDICATIONS:  1.  She is instructed to resume her Prilosec.  2.  Resume her fish oil.  3.  Resume her vitamin.  4.  Resume her Lisinopril/HCTZ 10/12.5 daily.  5.  Lopressor 50 mg twice daily.  6.  Lipitor 10 mg q.h.s.  7.  Ultram for pain, one every six hours as needed.  INSTRUCTIONS:  The patient will receive written instructions regarding   medications, activity, diet, wound care, and followup.   FOLLOWUP:  1.  Dr. Jenne Campus, two weeks.  2.  Dr. Dorris Fetch on November 23, 2005 at 12 noon.   FINAL DIAGNOSIS:  Severe left main coronary artery disease as described  above, now status post surgical revascularization.   OTHER DIAGNOSES:  1.  Migraine headaches.  2.  Hypertension.  3.  Hyperlipidemia.  4.  Coronary disease.  5.  Gastroesophageal reflux disease.  6.  Osteoarthritis.   PREVIOUS SURGERIES:  As dictated.  Rowe Clack, P.A.-C.    ______________________________  Salvatore Decent Dorris Fetch, M.D.    Sherryll Burger  D:  11/01/2005  T:  11/01/2005  Job:  [id number]   cc:   Salvatore Decent. Dorris Fetch, M.D.  [address]  [place]  [place] [address]   Darlin Priestly, MD  Fax: [phone]   Dorthula Rue. Early Chars, MD  Fax: [phone]

## 2011-02-17 NOTE — Op Note (Signed)
[name], [name]                 ACCOUNT NO.:  [id number]   MEDICAL RECORD NO.:  [id number]          PATIENT TYPE:  INP   LOCATION:  2309                         FACILITY:  [facility]   PHYSICIAN:  Salvatore Decent. Dorris Fetch, M.D.DATE OF BIRTH:  [birth year]   DATE OF PROCEDURE:  10/28/2005  DATE OF DISCHARGE:                                 OPERATIVE REPORT   PREOPERATIVE DIAGNOSIS:  Critical left main disease.   POSTOPERATIVE DIAGNOSIS:  Critical left main disease.   PROCEDURE:  Median sternotomy, extracorporeal circulation, coronary bypass  grafting x3 (left internal mammary artery to LAD, saphenous vein graft to  obtuse marginal, saphenous vein graft to distal right coronary), endoscopic  vein harvest right thigh.   SURGEON:  Salvatore Decent. Dorris Fetch, M.D.   ASSISTANT:  Jerold Coombe, P.A.   ANESTHESIA:  General.   FINDINGS:  Good-quality targets and good-quality conduits.   CLINICAL NOTE:  [name] is a 77 year old lady who has had several week  history of exertional angina. She underwent cardiac catheterization on  October 27, 2005, where she was found have a 95% left main stenosis. She  also had approximately 70% stenosis in her right coronary artery. Her left  ventricular function was well-preserved. She was advised to undergo coronary  artery bypass grafting.  The indications, risks, benefits and alternatives  were discussed in detail with the patient. She understood and accepted the  risks and agreed to proceed.   OPERATIVE NOTE:  [name] was brought to the preop holding area on October 28, 2005. There, the anesthesia service under the direction of Dr. Kipp Brood, placed lines for monitoring arterial, central venous and pulmonary  arterial pressure. EKG leads were placed for continuous telemetry.  Intravenous antibiotics were administered. She was taken to the operating  room, anesthetized and intubated. A Foley catheter was placed. The chest,  abdomen and legs  were prepped and draped in the usual fashion.   An incision was made in the medial aspect of the right leg at the level of  the knee.  The greater saphenous vein was identified and was harvested  endoscopically from the right thigh, it was a good quality vessel.  Simultaneously, a median sternotomy was performed and the left internal  mammary artery was harvested using standard technique. 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  The left internal mammary artery was a good-  quality conduit.  There was 5000 units of heparin administered during the  harvest of the grafts. The remainder of the full heparin dose taking into  account aprotinin was administered prior to opening the pericardium.   The pericardium was opened. The ascending aorta was inspected and palpated.  After confirming adequate anticoagulation with ACT measurement, the aorta  was cannulated via concentric 2-0 Ethibond pledgeted pursestring sutures. A  dual stage venous cannula was placed via pursestring suture and the right  atrial appendage. Cardiopulmonary bypass was instituted and the patient was  cooled to 32 degrees Celsius.  The coronary arteries were inspected and  anastomotic sites were chosen. The conduits were inspected and cut  to  length, a foam pad was placed in the pericardium to protect the left phrenic  nerve. A temperature probe was placed in the myocardial septum and a  cardioplegia cannula was placed in the ascending aorta.   The aorta was crossclamped. The left ventricle was emptied via the aortic  root vent. Aortic arrest  was achieved combination of cold antegrade blood  cardioplegia and topical iced saline. After achieving a complete diastolic  arrest  and adequate myocardial septal cooling to 9 degrees Celsius, the  following distal anastomoses were performed.   First, a reverse saphenous vein graft was placed end-to-side to the distal  right coronary. This was a 2 mm good-quality  target. The vein was good  quality. The anastomosis was performed with a running 7-0 Prolene suture.  There was excellent flow through the graft. Cardioplegia was administered.  There was good hemostasis.   Next, a reverse saphenous vein graft was placed to the second obtuse  marginal branch. This was a intramyocardial vessel, the largest of the  anterior lateral obtuse marginals, it was 1.5 mm in diameter. The  anastomosis was performed with a running 7-0 Prolene suture.  At the  completion of the anastomosis there was good flow through this graft.  Cardioplegia was administered. There was good hemostasis.   Next, the left internal mammary artery was brought through a window in the  pericardium. The distal end was beveled and then anastomosed end-to-side to  the distal LAD and with a running 8-0 Prolene suture. At the completion of  the anastomosis, the bulldog clamp was briefly removed to inspect for  hemostasis. There was bleeding from a branch off the mammary near the toe of  the anastomosis that was not appreciated prior to performing the  anastomosis. Attempts to control this was difficult due to the small size of  the vessel and concerns for narrowing the mammary at the toe of the  anastomosis, therefore the bulldog clamp was replaced on the mammary artery.  The anastomosis was taken down.  The mammary was reprepared and then  anastomosed end-to-side to the LAD with a running 8-0 Prolene suture. The  bulldog clamp was removed. At this time, there was excellent hemostasis.  Immediate and rapid septal rewarming was noted. The bulldog clamp was  replaced.   The vein grafts were cut to length. The proximal anastomoses were performed  through 4.5 mm punch aortotomies with running 6-0 Prolene sutures.  At the  completion of the final proximal anastomosis, the patient was placed in steep Trendelenburg position and lidocaine was administered. The bulldog  clamp was removed from the left  mammary artery. Immediate and rapid septal  rewarming was again noted. The aortic root was de-aired. The aortic  crossclamp was removed. The total crossclamp time was 65 minutes.   While the patient was being rewarmed, all proximal and distal anastomoses  were inspected for hemostasis. Epicardial pacing wires were placed on the  right ventricle and right atrium.  When the patient had rewarmed to a core  temperature 37 degrees Celsius, he was weaned from cardiopulmonary bypass on  the first attempt without difficulty. The total bypass time was 91 minutes.  The initial cardiac index was 2 liters per minute per sq m and the patient  remained hemodynamically stable throughout post bypass period.   A test dose of protamine was administered and was well tolerated.  The  atrial and aortic cannulae were removed.  The remainder of the protamine was  administered without incident. The chest was irrigated with 1 liter of warm  normal saline containing 1 gram of vancomycin. Hemostasis was achieved.  A  left pleural and a single mediastinal  chest tube were placed through separate subcostal incisions. The sternum was  closed with interrupted heavy gauge stainless steel wires. The remaining  incision was closed in standard fashion. Staples were used for the skin  closure due to very thin skin at the top of the sternotomy incision. The leg  incisions were closed with subcuticular sutures.           ______________________________  Salvatore Decent Dorris Fetch, M.D.     SCH/MEDQ  D:  10/28/2005  T:  10/29/2005  Job:  [id number]   cc:   Dani Gobble, MD  Fax: [phone]   Dorthula Rue. Early Chars, MD  Fax: [phone]

## 2011-02-17 NOTE — Procedures (Signed)
NAMEMarland [name]  [name], [name] NO.:  [id number]   MEDICAL RECORD NO.:  [id number]          PATIENT TYPE:  OUT   LOCATION:  RAD                           FACILITY:  [facility]   PHYSICIAN:  Dani Gobble, MD       DATE OF BIRTH:  08/02/1925   DATE OF PROCEDURE:  11/26/2006  DATE OF DISCHARGE:                                ECHOCARDIOGRAM   REFERRING PHYSICIAN:  Franchot Heidelberg, M.D. and Dani Gobble, MD   INDICATIONS:  A 77 year old female with past medical history of CAD  status post CABG, hypertension who was referred for evaluation of LV  function.   The technical quality of this study is adequate.   The aorta measures normally at 2.5 cm.   Left atrium is normal in size, measured at 3.4 cm.  The patient appeared  to be in sinus rhythm during this procedure.   Interventricular septum posterior wall within normal limits, and  thickness measured 1.0 cm and 0.9 cm, respectively.   The aortic valve is trileaflet and minimally thickened but without  limitation of leaflet excursion.  No significant aortic insufficiency is  noted.  Doppler interrogation of the aortic valve is within normal  limits.   The mitral valve also minimally thickened but without limitation in  leaflet excursion.  The interior leaflet is slightly elongated, but no  mitral valve prolapse is noted.  Mild to moderate mitral regurgitation  is appreciated.  Doppler interrogation of mitral valve is within normal  limits.   Pulmonic valve is not well visualized but appeared to be grossly  structurally normal.   Tricuspid valve appears grossly structurally normal with mild tricuspid  regurgitation noted.  There is no suggestion of pulmonary hypertension  on this study.   The left ventricle is normal in size with LV IDD measured at 4.0 cm.  The LV IC measured 3.0 cm.  Overall left ventricular systolic function  appears normal.  In some views, the anteroseptal wall appears  qualitatively subjectively  mildly hypokinetic as compared to the  remaining wall motion; however, this is not consistently seen in all  views.  Overall ejection fraction is normal.   The right atrium appears grossly normal in size.  The right ventricle is  mildly dilated with normal left ventricular systolic function.  The IVC  is normal in size with good collapse.   IMPRESSION:  1. Minimal aortic sclerosis without stenosis.  2. Minimally thickened mitral valve without limitation of leaflet      excursion.  3. Mild to moderate mitral regurgitation.  4. Mild tricuspid regurgitation.  5. Normal left ventricular size and overall ejection fraction.  In      some views, the anteroseptal wall appears to exhibit a subtle      qualitative subjective mild hypokinesis as compared to the      remaining wall function, but this is not appreciated in all views.      Again, overall ejection fraction is normal.  6. Mild right ventricular enlargement with normal left systolic      function.  ______________________________  Dani Gobble, MD     AB/MEDQ  D:  11/26/2006  T:  11/26/2006  Job:  [id number]   cc:   Franchot Heidelberg, M.D.

## 2011-02-17 NOTE — Procedures (Signed)
NAMESHAWAN, [name]                 ACCOUNT NO.:  [id number]   MEDICAL RECORD NO.:  [id number]          PATIENT TYPE:  OUT   LOCATION:  RAD                           FACILITY:  [facility]   PHYSICIAN:  Dani Gobble, MD       DATE OF BIRTH:  May 02, 1925   DATE OF PROCEDURE:  12/01/2005  DATE OF DISCHARGE:                                  ECHOCARDIOGRAM   ADDENDUM   There is a small anterior echo-free space suggestive of fat pad versus a  small pericardial effusion.  No evidence of hemodynamic compromise is  appreciated.           ______________________________  Dani Gobble, MD     AB/MEDQ  D:  12/01/2005  T:  12/01/2005  Job:  [id number]

## 2011-06-28 LAB — CREATININE, SERUM: Creatinine, Ser: 0.83

## 2011-07-14 LAB — COMPREHENSIVE METABOLIC PANEL
ALT: 18
AST: 26
CO2: 29
Chloride: 106
Creatinine, Ser: 0.78
GFR calc Af Amer: >60
GFR calc non Af Amer: >60
Glucose, Bld: 88
Sodium: 142
Total Bilirubin: 0.7

## 2011-07-14 LAB — CBC
Hemoglobin: 10.8 — ABNORMAL LOW
MCV: 93.8
RBC: 3.43 — ABNORMAL LOW
WBC: 3.6 — ABNORMAL LOW

## 2011-08-01 HISTORY — PX: US ECHOCARDIOGRAPHY: HXRAD669

## 2013-08-19 ENCOUNTER — Telehealth (HOSPITAL_COMMUNITY): Payer: Self-pay | Admitting: *Deleted

## 2013-08-19 NOTE — Telephone Encounter (Signed)
Per Dr. Salena Saner she does not need a stress test unless she is having problems.  Per the son she is not complaining of any symptoms.  Will keep appt. For an OV only.

## 2013-08-19 NOTE — Telephone Encounter (Signed)
Pts son called and said that he remembers the doctor mentioning a stress test for his mother before her next visit. He would like for her to have the stress test on the same day as the appt with Dr. Salena Saner I did mention that it would not be possible since the stress test takes a few hours. Please let me know if she needs a stress test before her visit on 12/9.

## 2013-09-11 ENCOUNTER — Encounter: Payer: Self-pay | Admitting: *Deleted

## 2013-09-12 ENCOUNTER — Ambulatory Visit (INDEPENDENT_AMBULATORY_CARE_PROVIDER_SITE_OTHER): Payer: Medicare HMO | Admitting: Cardiovascular Disease

## 2013-09-12 ENCOUNTER — Encounter: Payer: Self-pay | Admitting: Cardiovascular Disease

## 2013-09-12 VITALS — BP 100/62 | HR 68 | Resp 16 | Ht 67.0 in | Wt 128.0 lb

## 2013-09-12 DIAGNOSIS — I2510 Atherosclerotic heart disease of native coronary artery without angina pectoris: Secondary | ICD-10-CM

## 2013-09-12 DIAGNOSIS — I10 Essential (primary) hypertension: Secondary | ICD-10-CM

## 2013-09-12 DIAGNOSIS — E785 Hyperlipidemia, unspecified: Secondary | ICD-10-CM

## 2013-09-12 MED ORDER — METOPROLOL TARTRATE 50 MG PO TABS: 50.0000 mg | ORAL_TABLET | Freq: Every day | ORAL | Status: DC

## 2013-09-12 MED ORDER — LOSARTAN POTASSIUM-HCTZ 100-12.5 MG PO TABS: 30.0000 | ORAL_TABLET | Freq: Every day | ORAL | Status: DC

## 2013-09-12 NOTE — Patient Instructions (Signed)
Your physician recommends that you schedule a follow-up appointment in: one year  

## 2013-09-25 ENCOUNTER — Encounter: Payer: Self-pay | Admitting: Cardiovascular Disease

## 2013-09-25 NOTE — Assessment & Plan Note (Signed)
The pressure control is excellent if not excessive. She does not have dizziness or other symptoms of hypotension so no changes are made her medications today

## 2013-09-25 NOTE — Assessment & Plan Note (Signed)
She underwent bypass surgery for left main coronary artery disease in 2007 and has done well since that time. For the first time in a long time she has had to take a couple of nitroglycerin tablets but the episodes occurred at rest and I'm not certain that they were truly angina.

## 2013-09-25 NOTE — Assessment & Plan Note (Signed)
Excellent lipid profile on statin therapy

## 2013-09-25 NOTE — Progress Notes (Signed)
Patient ID: [name], female   DOB: December 03, 1924, 79 y.o.   MRN: [id number]      Reason for office visit Followup CAD status post CABG, hypertension, hyperlipidemia  [name] is now 7 years status bypass surgery, performed for high-grade proximal left main coronary stenosis as well as mid LAD and mid RCA stenoses . A couple of months ago she took nitroglycerin twice. Both episodes were brief, mild and occurred at rest. She's not sure of the chest discomfort she experienced was indeed the same as she had before her bypass surgery. No recent chest discomfort has occurred. She also denies shortness of breath, dizziness, syncope, intermittent claudication or palpitations all of her complaints are related to arthralgias she has mild ankle swelling which is also a chronic problem. Allergies  Allergen Reactions  . Aspirin     REACTION: High dose gives lip swelling  . Celecoxib     REACTION: GI upset  . Lipitor [Atorvastatin]   . Sulfonamide Derivatives     REACTION: Rash    Current Outpatient Prescriptions  Medication Sig Dispense Refill  . aspirin 81 MG tablet Take 81 mg by mouth daily.      Marland Kitchen DEXILANT 60 MG capsule       . losartan-hydrochlorothiazide (HYZAAR) 100-12.5 MG per tablet Take 30 tablets by mouth daily.  30 tablet  6  . lovastatin (MEVACOR) 20 MG tablet Take 20 mg by mouth at bedtime.      . metoprolol (LOPRESSOR) 50 MG tablet Take 1 tablet (50 mg total) by mouth daily. Take 1 tablet daily  30 tablet  6  . naproxen sodium (ANAPROX) 220 MG tablet Take 220 mg by mouth daily.      Marland Kitchen Specialty Vitamins Products (MAGNESIUM, AMINO ACID CHELATE,) 133 MG tablet Take 1 tablet by mouth 2 (two) times daily.       No current facility-administered medications for this visit.    Past Medical History  Diagnosis Date  . CAD (coronary artery disease)     CABG 10/28/2005 LIMA to LAD,SVG to OM,SVG to distal RCA  . Hyperlipidemia   . Systemic hypertension   . Carotid atherosclerosis    minor    Past Surgical History  Procedure Laterality Date  . Coronary artery bypass graft  10/28/2005    LIMA to LAD,SVG to OM,SVG to distal RCA  . Cardiac catheterization  10/27/2005    left main and severe 2 vessel disease.  . Abdominal hysterectomy  1966  . Cataract extraction  1998 & 2004  . US echocardiography  08/01/2011    mild to mod TR, ca+ AOV leaflets, AOV mildly sclerotic  . Nm myocar perf wall motion  01/11/2010    normal    Family History  Problem Relation Age of Onset  . Stroke Father   . Heart attack Sister   . Heart attack Brother   . Heart attack Brother   . Cancer Brother   . Cancer Brother   . Diabetes Brother   . Cancer Sister     History   Social History  . Marital Status: Married    Spouse Name: N/A    Number of Children: N/A  . Years of Education: N/A   Occupational History  . Not on file.   Social History Main Topics  . Smoking status: Never Smoker   . Smokeless tobacco: Not on file  . Alcohol Use: No  . Drug Use: No  . Sexual Activity: Not on file  Other Topics Concern  . Not on file   Social History Narrative  . No narrative on file    Review of systems: The patient specifically denies , dyspnea at rest or with exertion, orthopnea, paroxysmal nocturnal dyspnea, syncope, palpitations, focal neurological deficits, intermittent claudication, lower extremity edema, unexplained weight gain, cough, hemoptysis or wheezing.  The patient also denies abdominal pain, nausea, vomiting, dysphagia, diarrhea, constipation, polyuria, polydipsia, dysuria, hematuria, frequency, urgency, abnormal bleeding or bruising, fever, chills, unexpected weight changes, mood swings, change in skin or hair texture, change in voice quality, auditory or visual problems, allergic reactions or rashes, new musculoskeletal complaints other than usual "aches and pains".   PHYSICAL EXAM BP 100/62  Pulse 68  Resp 16  Ht 5\' 7"  (1.702 m)  Wt 128 lb (58.06 kg)  BMI  20.04 kg/m2 Prominent thoracic kyphosis General: Alert, oriented x3, no distress Head: no evidence of trauma, PERRL, EOMI, no exophtalmos or lid lag, no myxedema, no xanthelasma; normal ears, nose and oropharynx Neck: normal jugular venous pulsations and no hepatojugular reflux; brisk carotid pulses without delay and no carotid bruits Chest: clear to auscultation, no signs of consolidation by percussion or palpation, normal fremitus, symmetrical and full respiratory excursions Cardiovascular: normal position and quality of the apical impulse, regular rhythm, normal first and widely split second heart sounds, no rubs or gallops, rate 2/6 early peaking systolic ejection murmur in the aortic focus Abdomen: no tenderness or distention, no masses by palpation, no abnormal pulsatility or arterial bruits, normal bowel sounds, no hepatosplenomegaly Extremities: no clubbing, cyanosis or edema; 2+ radial, ulnar and brachial pulses bilaterally; 2+ right femoral, posterior tibial and dorsalis pedis pulses; 2+ left femoral, posterior tibial and dorsalis pedis pulses; no subclavian or femoral bruits Neurological: grossly nonfocal   EKG: Sinus rhythm, right bundle branch block, chronic ST depression and biphasic T-wave inversion in leads 83F and V3 through V6  Lipid Panel  Total cholesterol 156, triglycerides 77, HDL 51, LDL 90 BMET    Component Value Date/Time   NA 141 12/23/2010 1100   K 4.2 12/23/2010 1100   CL 107 12/23/2010 1100   CO2 28 12/23/2010 1100   GLUCOSE 77 12/23/2010 1100   BUN 12 12/23/2010 1100   CREATININE 0.73 12/23/2010 1100   CALCIUM 9.0 12/23/2010 1100   GFRNONAA >60 12/23/2010 1100   GFRAA  Value: >60        The eGFR has been calculated using the MDRD equation. This calculation has not been validated in all clinical situations. eGFR's persistently <60 mL/min signify possible Chronic Kidney Disease. 12/23/2010 1100     ASSESSMENT AND PLAN CAD She underwent bypass surgery for left main  coronary artery disease in 2007 and has done well since that time. For the first time in a long time she has had to take a couple of nitroglycerin tablets but the episodes occurred at rest and I'm not certain that they were truly angina.  HYPERLIPIDEMIA Excellent lipid profile on statin therapy  HYPERTENSION The pressure control is excellent if not excessive. She does not have dizziness or other symptoms of hypotension so no changes are made her medications today   Patient Instructions   Your physician recommends that you schedule a follow-up appointment in: one year     .     Meds ordered this encounter  Medications  . DEXILANT 60 MG capsule    Sig:   . DISCONTD: losartan-hydrochlorothiazide (HYZAAR) 100-12.5 MG per tablet    Sig:   . naproxen  sodium (ANAPROX) 220 MG tablet    Sig: Take 220 mg by mouth daily.  Marland Kitchen Specialty Vitamins Products (MAGNESIUM, AMINO ACID CHELATE,) 133 MG tablet    Sig: Take 1 tablet by mouth 2 (two) times daily.  . metoprolol (LOPRESSOR) 50 MG tablet    Sig: Take 1 tablet (50 mg total) by mouth daily. Take 1 tablet daily    Dispense:  30 tablet    Refill:  6  . losartan-hydrochlorothiazide (HYZAAR) 100-12.5 MG per tablet    Sig: Take 30 tablets by mouth daily.    Dispense:  30 tablet    Refill:  6    Alaine Loughney  Thurmon Fair, MD, [facility] [phone] office [phone] pager

## 2014-02-02 ENCOUNTER — Ambulatory Visit: Payer: Self-pay | Admitting: Obstetrics and Gynecology

## 2014-06-21 ENCOUNTER — Emergency Department (HOSPITAL_COMMUNITY): Payer: Medicare PPO

## 2014-06-21 ENCOUNTER — Encounter (HOSPITAL_COMMUNITY): Payer: Self-pay | Admitting: Emergency Medicine

## 2014-06-21 ENCOUNTER — Inpatient Hospital Stay (HOSPITAL_COMMUNITY)
Admission: EM | Admit: 2014-06-21 | Discharge: 2014-06-24 | DRG: 641 | Disposition: A | Discharge status: Home Health | Payer: Medicare PPO | Attending: Internal Medicine | Admitting: Internal Medicine

## 2014-06-21 DIAGNOSIS — Z951 Presence of aortocoronary bypass graft: Secondary | ICD-10-CM

## 2014-06-21 DIAGNOSIS — I2510 Atherosclerotic heart disease of native coronary artery without angina pectoris: Secondary | ICD-10-CM | POA: Diagnosis present

## 2014-06-21 DIAGNOSIS — I10 Essential (primary) hypertension: Secondary | ICD-10-CM | POA: Diagnosis present

## 2014-06-21 DIAGNOSIS — R5383 Other fatigue: Secondary | ICD-10-CM

## 2014-06-21 DIAGNOSIS — T502X5A Adverse effect of carbonic-anhydrase inhibitors, benzothiadiazides and other diuretics, initial encounter: Secondary | ICD-10-CM | POA: Diagnosis present

## 2014-06-21 DIAGNOSIS — E039 Hypothyroidism, unspecified: Secondary | ICD-10-CM | POA: Diagnosis present

## 2014-06-21 DIAGNOSIS — R5381 Other malaise: Secondary | ICD-10-CM

## 2014-06-21 DIAGNOSIS — R531 Weakness: Secondary | ICD-10-CM

## 2014-06-21 DIAGNOSIS — Z8701 Personal history of pneumonia (recurrent): Secondary | ICD-10-CM | POA: Diagnosis not present

## 2014-06-21 DIAGNOSIS — E785 Hyperlipidemia, unspecified: Secondary | ICD-10-CM | POA: Diagnosis present

## 2014-06-21 DIAGNOSIS — Z7982 Long term (current) use of aspirin: Secondary | ICD-10-CM | POA: Diagnosis not present

## 2014-06-21 DIAGNOSIS — J40 Bronchitis, not specified as acute or chronic: Secondary | ICD-10-CM

## 2014-06-21 DIAGNOSIS — J209 Acute bronchitis, unspecified: Secondary | ICD-10-CM | POA: Diagnosis present

## 2014-06-21 DIAGNOSIS — Z79899 Other long term (current) drug therapy: Secondary | ICD-10-CM | POA: Diagnosis not present

## 2014-06-21 DIAGNOSIS — J208 Acute bronchitis due to other specified organisms: Secondary | ICD-10-CM

## 2014-06-21 DIAGNOSIS — E871 Hypo-osmolality and hyponatremia: Principal | ICD-10-CM

## 2014-06-21 DIAGNOSIS — E860 Dehydration: Secondary | ICD-10-CM

## 2014-06-21 DIAGNOSIS — K219 Gastro-esophageal reflux disease without esophagitis: Secondary | ICD-10-CM | POA: Diagnosis present

## 2014-06-21 DIAGNOSIS — K5900 Constipation, unspecified: Secondary | ICD-10-CM | POA: Diagnosis present

## 2014-06-21 HISTORY — DX: Atherosclerotic heart disease of native coronary artery without angina pectoris: I25.10

## 2014-06-21 HISTORY — DX: Essential (primary) hypertension: I10

## 2014-06-21 HISTORY — DX: Malignant (primary) neoplasm, unspecified: C80.1

## 2014-06-21 LAB — CBC WITH DIFFERENTIAL/PLATELET
BASOS ABS: 0 10*3/uL (ref 0.0–0.1)
Basophils Relative: 0 % (ref 0–1)
Eosinophils Absolute: 0.1 10*3/uL (ref 0.0–0.7)
Eosinophils Relative: 1 % (ref 0–5)
HCT: 33.7 % — ABNORMAL LOW (ref 36.0–46.0)
Hemoglobin: 12.2 g/dL (ref 12.0–15.0)
LYMPHS ABS: 0.8 10*3/uL (ref 0.7–4.0)
Lymphocytes Relative: 9 % — ABNORMAL LOW (ref 12–46)
MCH: 30.5 pg (ref 26.0–34.0)
MCHC: 36.2 g/dL — ABNORMAL HIGH (ref 30.0–36.0)
MCV: 84.3 fL (ref 78.0–100.0)
MONO ABS: 0.8 10*3/uL (ref 0.1–1.0)
MONOS PCT: 9 % (ref 3–12)
NEUTROS ABS: 7.4 10*3/uL (ref 1.7–7.7)
Neutrophils Relative %: 81 % — ABNORMAL HIGH (ref 43–77)
Platelets: 275 10*3/uL (ref 150–400)
RBC: 4 MIL/uL (ref 3.87–5.11)
RDW: 12.7 % (ref 11.5–15.5)
WBC: 9.2 10*3/uL (ref 4.0–10.5)

## 2014-06-21 LAB — URINALYSIS, ROUTINE W REFLEX MICROSCOPIC
Bilirubin Urine: NEGATIVE
Glucose, UA: NEGATIVE mg/dL
HGB URINE DIPSTICK: NEGATIVE
Ketones, ur: NEGATIVE mg/dL
LEUKOCYTES UA: NEGATIVE
NITRITE: NEGATIVE
PROTEIN: NEGATIVE mg/dL
Specific Gravity, Urine: 1.015 (ref 1.005–1.030)
UROBILINOGEN UA: 1 mg/dL (ref 0.0–1.0)
pH: 6.5 (ref 5.0–8.0)

## 2014-06-21 LAB — COMPREHENSIVE METABOLIC PANEL
ALT: 17 U/L (ref 0–35)
ANION GAP: 11 (ref 5–15)
AST: 28 U/L (ref 0–37)
Albumin: 3.6 g/dL (ref 3.5–5.2)
Alkaline Phosphatase: 82 U/L (ref 39–117)
BILIRUBIN TOTAL: 0.4 mg/dL (ref 0.3–1.2)
BUN: 18 mg/dL (ref 6–23)
CHLORIDE: 80 meq/L — AB (ref 96–112)
CO2: 25 meq/L (ref 19–32)
Calcium: 9.3 mg/dL (ref 8.4–10.5)
Creatinine, Ser: 0.73 mg/dL (ref 0.50–1.10)
GFR calc Af Amer: 87 mL/min — ABNORMAL LOW (ref >90)
GFR calc non Af Amer: 75 mL/min — ABNORMAL LOW (ref >90)
GLUCOSE: 125 mg/dL — AB (ref 70–99)
Potassium: 4.9 mEq/L (ref 3.7–5.3)
Sodium: 116 mEq/L — CL (ref 137–147)
Total Protein: 6.8 g/dL (ref 6.0–8.3)

## 2014-06-21 LAB — CREATININE, SERUM
Creatinine, Ser: 0.71 mg/dL (ref 0.50–1.10)
GFR calc Af Amer: 87 mL/min — ABNORMAL LOW (ref >90)
GFR calc non Af Amer: 75 mL/min — ABNORMAL LOW (ref >90)

## 2014-06-21 LAB — LACTIC ACID, PLASMA: Lactic Acid, Venous: 1.4 mmol/L (ref 0.5–2.2)

## 2014-06-21 LAB — CBC
HCT: 31.6 % — ABNORMAL LOW (ref 36.0–46.0)
Hemoglobin: 11.5 g/dL — ABNORMAL LOW (ref 12.0–15.0)
MCH: 30.6 pg (ref 26.0–34.0)
MCHC: 36.4 g/dL — AB (ref 30.0–36.0)
MCV: 84 fL (ref 78.0–100.0)
Platelets: 251 10*3/uL (ref 150–400)
RBC: 3.76 MIL/uL — ABNORMAL LOW (ref 3.87–5.11)
RDW: 12.9 % (ref 11.5–15.5)
WBC: 6.2 10*3/uL (ref 4.0–10.5)

## 2014-06-21 LAB — PRO B NATRIURETIC PEPTIDE: Pro B Natriuretic peptide (BNP): 272.6 pg/mL (ref 0–450)

## 2014-06-21 LAB — SODIUM, URINE, RANDOM: Sodium, Ur: 72 mEq/L

## 2014-06-21 MED ORDER — LEVOTHYROXINE SODIUM 50 MCG PO TABS
50.0000 ug | ORAL_TABLET | Freq: Every day | ORAL | Status: DC
  Administered 2014-06-22 – 2014-06-24 (×3): 50 ug via ORAL
  Filled 2014-06-21 (×3): qty 1

## 2014-06-21 MED ORDER — SODIUM CHLORIDE 0.9 % IV SOLN
INTRAVENOUS | Status: DC
  Administered 2014-06-21 – 2014-06-24 (×4): via INTRAVENOUS

## 2014-06-21 MED ORDER — ALBUTEROL SULFATE (2.5 MG/3ML) 0.083% IN NEBU
2.5000 mg | INHALATION_SOLUTION | Freq: Four times a day (QID) | RESPIRATORY_TRACT | Status: DC
  Administered 2014-06-21 – 2014-06-24 (×10): 2.5 mg via RESPIRATORY_TRACT
  Filled 2014-06-21 (×10): qty 3

## 2014-06-21 MED ORDER — ACETAMINOPHEN 650 MG RE SUPP: 650.0000 mg | Freq: Four times a day (QID) | RECTAL | Status: DC | PRN

## 2014-06-21 MED ORDER — GUAIFENESIN ER 600 MG PO TB12
600.0000 mg | ORAL_TABLET | Freq: Two times a day (BID) | ORAL | Status: DC
  Administered 2014-06-21 – 2014-06-24 (×6): 600 mg via ORAL
  Filled 2014-06-21 (×6): qty 1

## 2014-06-21 MED ORDER — ACETAMINOPHEN 325 MG PO TABS: 650.0000 mg | ORAL_TABLET | Freq: Four times a day (QID) | ORAL | Status: DC | PRN

## 2014-06-21 MED ORDER — METOPROLOL TARTRATE 50 MG PO TABS
50.0000 mg | ORAL_TABLET | Freq: Every day | ORAL | Status: DC
  Administered 2014-06-22 – 2014-06-24 (×3): 50 mg via ORAL
  Filled 2014-06-21 (×4): qty 1

## 2014-06-21 MED ORDER — ONDANSETRON HCL 4 MG/2ML IJ SOLN
4.0000 mg | Freq: Four times a day (QID) | INTRAMUSCULAR | Status: DC | PRN
Start: 2014-06-21 — End: 2014-06-24

## 2014-06-21 MED ORDER — SODIUM CHLORIDE 0.9 % IV SOLN
Freq: Once | INTRAVENOUS | Status: AC
  Administered 2014-06-21: 15:00:00 via INTRAVENOUS

## 2014-06-21 MED ORDER — BENZONATATE 100 MG PO CAPS: 200.0000 mg | ORAL_CAPSULE | Freq: Two times a day (BID) | ORAL | Status: DC

## 2014-06-21 MED ORDER — PANTOPRAZOLE SODIUM 40 MG PO TBEC
40.0000 mg | DELAYED_RELEASE_TABLET | Freq: Every day | ORAL | Status: DC
  Administered 2014-06-22 – 2014-06-24 (×3): 40 mg via ORAL
  Filled 2014-06-21 (×4): qty 1

## 2014-06-21 MED ORDER — SODIUM CHLORIDE 0.9 % IJ SOLN
3.0000 mL | Freq: Two times a day (BID) | INTRAMUSCULAR | Status: DC
  Administered 2014-06-22 – 2014-06-23 (×3): 3 mL via INTRAVENOUS

## 2014-06-21 MED ORDER — GABAPENTIN 100 MG PO CAPS
100.0000 mg | ORAL_CAPSULE | Freq: Every day | ORAL | Status: DC
  Administered 2014-06-21: 200 mg via ORAL
  Administered 2014-06-22 – 2014-06-23 (×2): 100 mg via ORAL
  Filled 2014-06-21: qty 1
  Filled 2014-06-21 (×2): qty 2

## 2014-06-21 MED ORDER — BENZONATATE 100 MG PO CAPS
200.0000 mg | ORAL_CAPSULE | Freq: Two times a day (BID) | ORAL | Status: DC
Start: 2014-06-21 — End: 2014-06-24
  Administered 2014-06-21 – 2014-06-24 (×6): 200 mg via ORAL
  Filled 2014-06-21 (×6): qty 2

## 2014-06-21 MED ORDER — SIMVASTATIN 10 MG PO TABS
10.0000 mg | ORAL_TABLET | Freq: Every day | ORAL | Status: DC
  Administered 2014-06-22 – 2014-06-23 (×2): 10 mg via ORAL
  Filled 2014-06-21 (×2): qty 1

## 2014-06-21 MED ORDER — ALBUTEROL SULFATE (2.5 MG/3ML) 0.083% IN NEBU: 2.5000 mg | INHALATION_SOLUTION | RESPIRATORY_TRACT | Status: DC | PRN

## 2014-06-21 MED ORDER — ALBUTEROL SULFATE (2.5 MG/3ML) 0.083% IN NEBU
2.5000 mg | INHALATION_SOLUTION | Freq: Once | RESPIRATORY_TRACT | Status: AC
  Administered 2014-06-21: 2.5 mg via RESPIRATORY_TRACT
  Filled 2014-06-21: qty 3

## 2014-06-21 MED ORDER — ENOXAPARIN SODIUM 40 MG/0.4ML ~~LOC~~ SOLN
40.0000 mg | SUBCUTANEOUS | Status: DC
  Administered 2014-06-21 – 2014-06-23 (×3): 40 mg via SUBCUTANEOUS
  Filled 2014-06-21 (×3): qty 0.4

## 2014-06-21 MED ORDER — ASPIRIN EC 81 MG PO TBEC
81.0000 mg | DELAYED_RELEASE_TABLET | Freq: Two times a day (BID) | ORAL | Status: DC
  Administered 2014-06-21 – 2014-06-24 (×6): 81 mg via ORAL
  Filled 2014-06-21 (×6): qty 1

## 2014-06-21 MED ORDER — ONDANSETRON HCL 4 MG PO TABS: 4.0000 mg | ORAL_TABLET | Freq: Four times a day (QID) | ORAL | Status: DC | PRN

## 2014-06-21 NOTE — ED Notes (Signed)
Diagnosed w/PNA Monday by Dr. Nevada Crane.  Was on Levaquin, but made stomach upset. Placed on different Augmentin Friday, but has had no improvement.  Continues w/cough, congestion, weakness.  Reports chills and slight nausea.

## 2014-06-21 NOTE — H&P (Signed)
[facility] History and Physical  [name] QMG:[id number] DOB: 29-Oct-1924 DOA: 06/21/2014  Referring physician: Dr. Eda Paschal, ER physician PCP: Delphina Cahill, MD   Chief Complaint: Generalized weakness  HPI: [name] is a 80 y.o. female who had initially presented to her primary care physician on 9/22 with complaints of productive cough and generalized weakness. It was felt that she may have pneumonia she was started on a course of Levaquin. With antibiotics, patient had significant nausea and had decreased by mouth intake. She continues to take the rest of her medications including her diuretics. She went back to her primary care physician due to persistent nausea and antibiotics were changed to Augmentin. She was also given a prescription for Zofran. Unfortunately, she became increasingly fatigued and generally weak. Her family noted that she was becoming somewhat somnolent. She was brought to the emergency room for evaluation where she was found to have serum sodium of 116. Chest x-ray did not show any underlying pneumonia. She did receive a breathing treatment which her family reports that greatly improved her cough and shortness of breath. Patient be admitted for further treatment of hyponatremia.   Review of Systems:  Pertinent positives as per history of present illness, otherwise negative   Past Medical History  Diagnosis Date  . Coronary artery disease   . Hypertension   . Cancer     vaginal   Past Surgical History  Procedure Laterality Date  . Coronary artery bypass graft    . Abdominal hysterectomy    . Cesarean section    . Cholecystectomy     Social History:  reports that she has never smoked. She has never used smokeless tobacco. She reports that she does not drink alcohol or use illicit drugs.  Allergies  Allergen Reactions  . Sulfa Antibiotics Rash    Family history: Positive for heart disease and hypertension   Prior to Admission medications   Medication  Sig Start Date End Date Taking? Authorizing Provider  amoxicillin-clavulanate (AUGMENTIN) 500-125 MG per tablet Take 1 tablet by mouth 2 (two) times daily.   Yes Historical Provider, MD  aspirin EC 81 MG tablet Take 81 mg by mouth 2 (two) times daily.   Yes Historical Provider, MD  dexlansoprazole (DEXILANT) 60 MG capsule Take 60 mg by mouth daily.   Yes Historical Provider, MD  dextromethorphan-guaiFENesin (MUCINEX DM) 30-600 MG per 12 hr tablet Take 1 tablet by mouth 2 (two) times daily.   Yes Historical Provider, MD  furosemide (LASIX) 20 MG tablet Take 20 mg by mouth.   Yes Historical Provider, MD  gabapentin (NEURONTIN) 100 MG capsule Take 100-300 mg by mouth at bedtime.   Yes Historical Provider, MD  levothyroxine (SYNTHROID, LEVOTHROID) 50 MCG tablet Take 50 mcg by mouth daily before breakfast.   Yes Historical Provider, MD  losartan-hydrochlorothiazide (HYZAAR) 100-12.5 MG per tablet Take 1 tablet by mouth daily.   Yes Historical Provider, MD  lovastatin (MEVACOR) 20 MG tablet Take 20 mg by mouth daily.   Yes Historical Provider, MD  Magnesium 250 MG TABS Take 1 tablet by mouth daily.   Yes Historical Provider, MD  metoprolol (LOPRESSOR) 50 MG tablet Take 50 mg by mouth daily.   Yes Historical Provider, MD  naproxen sodium (ANAPROX) 220 MG tablet Take 220 mg by mouth at bedtime.   Yes Historical Provider, MD  ondansetron (ZOFRAN) 4 MG tablet Take 4 mg by mouth every 8 (eight) hours as needed for nausea or vomiting.   Yes Historical Provider,  MD  potassium chloride (K-DUR) 10 MEQ tablet Take 10 mEq by mouth daily.   Yes Historical Provider, MD  terbinafine (LAMISIL) 250 MG tablet Take 250 mg by mouth daily.   Yes Historical Provider, MD  triamcinolone cream (KENALOG) 0.1 % Apply 1 application topically 2 (two) times daily.   Yes Historical Provider, MD  benzonatate (TESSALON) 200 MG capsule Take 200 mg by mouth 2 (two) times daily.    Historical Provider, MD  levofloxacin (LEVAQUIN) 500 MG  tablet Take 500 mg by mouth daily.    Historical Provider, MD   Physical Exam: Filed Vitals:   06/21/14 1356 06/21/14 1500 06/21/14 1600 06/21/14 1700  BP: 119/49  130/56 107/85  Pulse: 61  61 64  Temp: 99.3 F (37.4 C)     Resp: 16     Height: 5\' 3"  (1.6 m)     Weight: 56.7 kg (125 lb)     SpO2: 98% 94% 100% 100%    Wt Readings from Last 3 Encounters:  06/21/14 56.7 kg (125 lb)    General:  Appears calm and comfortable Eyes: PERRL, normal lids, irises & conjunctiva ENT: mucous membranes are dry Neck: no LAD, masses or thyromegaly Cardiovascular: RRR, no m/r/g. No LE edema. Telemetry: SR, no arrhythmias  Respiratory: diffuse bilateral mild expiratory wheezing. Normal respiratory effort. Abdomen: soft, ntnd Skin: no rash or induration seen on limited exam Musculoskeletal: grossly normal tone BUE/BLE Psychiatric: grossly normal mood and affect, speech fluent and appropriate Neurologic: grossly non-focal.          Labs on Admission:  Basic Metabolic Panel:  Recent Labs Lab 06/21/14 1427  NA 116*  K 4.9  CL 80*  CO2 25  GLUCOSE 125*  BUN 18  CREATININE 0.73  CALCIUM 9.3   Liver Function Tests:  Recent Labs Lab 06/21/14 1427  AST 28  ALT 17  ALKPHOS 82  BILITOT 0.4  PROT 6.8  ALBUMIN 3.6   No results found for this basename: LIPASE, AMYLASE,  in the last 168 hours No results found for this basename: AMMONIA,  in the last 168 hours CBC:  Recent Labs Lab 06/21/14 1411  WBC 9.2  NEUTROABS 7.4  HGB 12.2  HCT 33.7*  MCV 84.3  PLT 275   Cardiac Enzymes: No results found for this basename: CKTOTAL, CKMB, CKMBINDEX, TROPONINI,  in the last 168 hours  BNP (last 3 results)  Recent Labs  06/21/14 1427  PROBNP 272.6   CBG: No results found for this basename: GLUCAP,  in the last 168 hours  Radiological Exams on Admission: Dg Chest 2 View  06/21/2014   CLINICAL DATA:  Weakness. Cough. Congestion. Shortness of breath. Fever.  EXAM: CHEST  2 VIEW   COMPARISON:  None.  FINDINGS: Prior CABG. Mild biapical pleural parenchymal scarring. Chronic appearing compression fracture at the thoracolumbar junction if compatible with osteoporosis.  Heart size within normal limits. The lungs appear clear. No pleural effusion.  IMPRESSION: 1. No acute findings. 2. Remote compression fracture at the thoracolumbar junction.   Electronically Signed   By: Sherryl Barters M.D.   On: 06/21/2014 15:50   Assessment/Plan Principal Problem:   Hyponatremia Active Problems:   Generalized weakness   Dehydration   Acute bronchitis   Fatigue   GERD without esophagitis   Benign essential HTN   Hypothyroidism   Hyperlipidemia   1. Hyponatremia. Likely hypovolemic in the setting of decreased by mouth intake and ongoing diuretic use. We'll hold diuretics at this time and  give IV fluids. The patient is awake, alert care on a conversation appropriately. She'll be monitored on telemetry. Anticipate she should improve overnight with IV fluids. 2. Acute bronchitis. She's already had a significant course of antibiotics. We'll treat supportively with bronchodilators and mucolytics. Can consider steroids if wheezing does not improve with bronchodilators. 3. Dehydration. Continue IV fluids 4. Hypertension. We'll continue metoprolol but hold diuretics at this time. 5. Hypothyroidism continue Synthroid and check TSH as this may be contributing to #1. 6. Hyperlipidemia. Continue statin 7. Generalized weakness likely related to #1. She'll improved with IV hydration. We'll request physical therapy evaluation  Code Status: full code DVT Prophylaxis: lovenox Family Communication: discussed with patient and daughter Disposition Plan: discharge home once improved  Time spent: 47mins  MEMON,JEHANZEB [facility] Pager [phone]

## 2014-06-21 NOTE — ED Provider Notes (Signed)
CSN: [id number]     Arrival date & time 06/21/14  1349 History   First MD Initiated Contact with Patient 06/21/14 1410     Chief Complaint  Patient presents with  . Weakness  . Cough     (Consider location/radiation/quality/duration/timing/severity/associated sxs/prior Treatment) HPI Comments: Pt is an 80 y/o female with hx of CAD s/p CABG with one week of cough and SOB which was gradual in onset - seen by her PMD and prescribed Levaquin (nausea) and changed to Augmentin - despite this, she has worsened with cough and now wth generalized weakness and fatigue with severe difficulty getting out of bed.  No fevers, no diarrhea, no swelling, no rashes.  Sx are now severe  Patient is a 80 y.o. female presenting with weakness and cough. The history is provided by the patient and a relative.  Weakness  Cough   Past Medical History  Diagnosis Date  . Coronary artery disease   . Hypertension   . Cancer     vaginal   Past Surgical History  Procedure Laterality Date  . Coronary artery bypass graft    . Abdominal hysterectomy    . Cesarean section    . Cholecystectomy     History reviewed. No pertinent family history. History  Substance Use Topics  . Smoking status: Never Smoker   . Smokeless tobacco: Never Used  . Alcohol Use: No   OB History   Grav Para Term Preterm Abortions TAB SAB Ect Mult Living                 Review of Systems  Respiratory: Positive for cough.   Neurological: Positive for weakness.  All other systems reviewed and are negative.     Allergies  Sulfa antibiotics  Home Medications   Prior to Admission medications   Medication Sig Start Date End Date Taking? Authorizing Provider  amoxicillin-clavulanate (AUGMENTIN) 500-125 MG per tablet Take 1 tablet by mouth 2 (two) times daily.   Yes Historical Provider, MD  aspirin EC 81 MG tablet Take 81 mg by mouth 2 (two) times daily.   Yes Historical Provider, MD  dexlansoprazole (DEXILANT) 60 MG capsule  Take 60 mg by mouth daily.   Yes Historical Provider, MD  dextromethorphan-guaiFENesin (MUCINEX DM) 30-600 MG per 12 hr tablet Take 1 tablet by mouth 2 (two) times daily.   Yes Historical Provider, MD  furosemide (LASIX) 20 MG tablet Take 20 mg by mouth.   Yes Historical Provider, MD  gabapentin (NEURONTIN) 100 MG capsule Take 100-300 mg by mouth at bedtime.   Yes Historical Provider, MD  levothyroxine (SYNTHROID, LEVOTHROID) 50 MCG tablet Take 50 mcg by mouth daily before breakfast.   Yes Historical Provider, MD  losartan-hydrochlorothiazide (HYZAAR) 100-12.5 MG per tablet Take 1 tablet by mouth daily.   Yes Historical Provider, MD  lovastatin (MEVACOR) 20 MG tablet Take 20 mg by mouth daily.   Yes Historical Provider, MD  Magnesium 250 MG TABS Take 1 tablet by mouth daily.   Yes Historical Provider, MD  metoprolol (LOPRESSOR) 50 MG tablet Take 50 mg by mouth daily.   Yes Historical Provider, MD  naproxen sodium (ANAPROX) 220 MG tablet Take 220 mg by mouth at bedtime.   Yes Historical Provider, MD  ondansetron (ZOFRAN) 4 MG tablet Take 4 mg by mouth every 8 (eight) hours as needed for nausea or vomiting.   Yes Historical Provider, MD  potassium chloride (K-DUR) 10 MEQ tablet Take 10 mEq by mouth daily.  Yes Historical Provider, MD  terbinafine (LAMISIL) 250 MG tablet Take 250 mg by mouth daily.   Yes Historical Provider, MD  triamcinolone cream (KENALOG) 0.1 % Apply 1 application topically 2 (two) times daily.   Yes Historical Provider, MD  benzonatate (TESSALON) 200 MG capsule Take 200 mg by mouth 2 (two) times daily.    Historical Provider, MD  levofloxacin (LEVAQUIN) 500 MG tablet Take 500 mg by mouth daily.    Historical Provider, MD   BP 130/56  Pulse 61  Temp(Src) 99.3 F (37.4 C)  Resp 16  Ht 5\' 3"  (1.6 m)  Wt 125 lb (56.7 kg)  BMI 22.15 kg/m2  SpO2 100% Physical Exam  Nursing note and vitals reviewed. Constitutional: She appears well-developed and well-nourished. No distress.   HENT:  Head: Normocephalic and atraumatic.  Mouth/Throat: No oropharyngeal exudate.  Mild splotchy erythema to the palate, no exudate, hypertrophy or asymetery  Eyes: Conjunctivae and EOM are normal. Pupils are equal, round, and reactive to light. Right eye exhibits no discharge. Left eye exhibits no discharge. No scleral icterus.  Neck: Normal range of motion. Neck supple. No JVD present. No thyromegaly present.  Cardiovascular: Normal rate, regular rhythm, normal heart sounds and intact distal pulses.  Exam reveals no gallop and no friction rub.   No murmur heard. Pulmonary/Chest: Effort normal. No respiratory distress. She has wheezes ( mild expiratory). She has no rales.  Abdominal: Soft. Bowel sounds are normal. She exhibits no distension and no mass. There is no tenderness.  Musculoskeletal: Normal range of motion. She exhibits no edema and no tenderness.  Lymphadenopathy:    She has no cervical adenopathy.  Neurological: She is alert. Coordination normal.  Skin: Skin is warm and dry. No rash noted. No erythema.  Psychiatric: She has a normal mood and affect. Her behavior is normal.    ED Course  Procedures (including critical care time) Labs Review Labs Reviewed  CBC WITH DIFFERENTIAL - Abnormal; Notable for the following:    HCT 33.7 (*)    MCHC 36.2 (*)    Neutrophils Relative % 81 (*)    Lymphocytes Relative 9 (*)    All other components within normal limits  COMPREHENSIVE METABOLIC PANEL - Abnormal; Notable for the following:    Sodium 116 (*)    Chloride 80 (*)    Glucose, Bld 125 (*)    GFR calc non Af Amer 75 (*)    GFR calc Af Amer 87 (*)    All other components within normal limits  URINALYSIS, ROUTINE W REFLEX MICROSCOPIC  LACTIC ACID, PLASMA  PRO B NATRIURETIC PEPTIDE    Imaging Review Dg Chest 2 View  06/21/2014   CLINICAL DATA:  Weakness. Cough. Congestion. Shortness of breath. Fever.  EXAM: CHEST  2 VIEW  COMPARISON:  None.  FINDINGS: Prior CABG. Mild  biapical pleural parenchymal scarring. Chronic appearing compression fracture at the thoracolumbar junction if compatible with osteoporosis.  Heart size within normal limits. The lungs appear clear. No pleural effusion.  IMPRESSION: 1. No acute findings. 2. Remote compression fracture at the thoracolumbar junction.   Electronically Signed   By: Sherryl Barters M.D.   On: 06/21/2014 15:50      MDM   Final diagnoses:  Hyponatremia  Bronchitis    The pt has diffuse generalized weakness but lungs overall clear, no fever, check labs and xray.   The patient was found to be hyponatremic, no signs of pulmonary abnormalities, vital signs remained in normal range but  due to diffuse weakness and hyponatremia the patient will need to be admitted to the hospital, discussed with the hospitalist who agrees.  Meds given in ED:  Medications  albuterol (PROVENTIL) (2.5 MG/3ML) 0.083% nebulizer solution 2.5 mg (2.5 mg Nebulization Given 06/21/14 1500)  0.9 %  sodium chloride infusion ( Intravenous New Bag/Given 06/21/14 1500)    New Prescriptions   No medications on file      Johnna Acosta, MD 06/21/14 1647

## 2014-06-21 NOTE — ED Notes (Signed)
CRITICAL VALUE ALERT  Critical value received:  Sodium  Date of notification:  06/21/14  Time of notification:  1512  Critical value read back:Yes.    Nurse who received alert:  Dyanara Cozza,RN  MD notified (1st page):  Sabra Heck  Time of first page:  [phone]

## 2014-06-22 LAB — BASIC METABOLIC PANEL
ANION GAP: 13 (ref 5–15)
ANION GAP: 10 (ref 5–15)
Anion gap: 12 (ref 5–15)
BUN: 14 mg/dL (ref 6–23)
BUN: 11 mg/dL (ref 6–23)
BUN: 10 mg/dL (ref 6–23)
CALCIUM: 8.5 mg/dL (ref 8.4–10.5)
CHLORIDE: 87 meq/L — AB (ref 96–112)
CHLORIDE: 90 meq/L — AB (ref 96–112)
CO2: 22 meq/L (ref 19–32)
CO2: 23 mEq/L (ref 19–32)
CO2: 23 mEq/L (ref 19–32)
CREATININE: 0.7 mg/dL (ref 0.50–1.10)
CREATININE: 0.86 mg/dL (ref 0.50–1.10)
Calcium: 8.6 mg/dL (ref 8.4–10.5)
Calcium: 8.5 mg/dL (ref 8.4–10.5)
Chloride: 90 mEq/L — ABNORMAL LOW (ref 96–112)
Creatinine, Ser: 0.71 mg/dL (ref 0.50–1.10)
GFR calc Af Amer: 88 mL/min — ABNORMAL LOW (ref >90)
GFR calc non Af Amer: 75 mL/min — ABNORMAL LOW (ref >90)
GFR, EST AFRICAN AMERICAN: 68 mL/min — AB (ref >90)
GFR, EST AFRICAN AMERICAN: 87 mL/min — AB (ref >90)
GFR, EST NON AFRICAN AMERICAN: 76 mL/min — AB (ref >90)
GFR, EST NON AFRICAN AMERICAN: 59 mL/min — AB (ref >90)
GLUCOSE: 100 mg/dL — AB (ref 70–99)
Glucose, Bld: 106 mg/dL — ABNORMAL HIGH (ref 70–99)
Glucose, Bld: 136 mg/dL — ABNORMAL HIGH (ref 70–99)
Potassium: 4.4 mEq/L (ref 3.7–5.3)
Potassium: 4.9 mEq/L (ref 3.7–5.3)
Potassium: 4.2 mEq/L (ref 3.7–5.3)
Sodium: 122 mEq/L — ABNORMAL LOW (ref 137–147)
Sodium: 123 mEq/L — ABNORMAL LOW (ref 137–147)
Sodium: 125 mEq/L — ABNORMAL LOW (ref 137–147)

## 2014-06-22 LAB — CBC
HEMATOCRIT: 31.2 % — AB (ref 36.0–46.0)
Hemoglobin: 11.3 g/dL — ABNORMAL LOW (ref 12.0–15.0)
MCH: 30.7 pg (ref 26.0–34.0)
MCHC: 36.2 g/dL — AB (ref 30.0–36.0)
MCV: 84.8 fL (ref 78.0–100.0)
PLATELETS: 258 10*3/uL (ref 150–400)
RBC: 3.68 MIL/uL — AB (ref 3.87–5.11)
RDW: 12.9 % (ref 11.5–15.5)
WBC: 7.1 10*3/uL (ref 4.0–10.5)

## 2014-06-22 LAB — TSH: TSH: 0.281 u[IU]/mL — ABNORMAL LOW (ref 0.350–4.500)

## 2014-06-22 LAB — OSMOLALITY, URINE: Osmolality, Ur: 563 mOsm/kg (ref 390–1090)

## 2014-06-22 LAB — OSMOLALITY: Osmolality: 250 mOsm/kg — ABNORMAL LOW (ref 275–300)

## 2014-06-22 NOTE — Progress Notes (Signed)
UR chart review completed.  

## 2014-06-22 NOTE — Evaluation (Signed)
Physical Therapy Evaluation Patient Details Name: [name] MRN: [id number] DOB: [birth year] Today's Date: 06/22/2014   History of Present Illness  Pt is an 80 year old female admitted with bronchitis and hyponatremia.  She developed nausea and a cough at home and was unable to eat.  She developed dehydration and was found to have current diagnosis.  Pt describes significant weakness.  She lives with her husband and son.  Clinical Impression   Pt was seen for evaluation and found to be generally weak and deconditioned from baseline.  She continues to have a frequent cough and reports general malaise.  Currently, she needs min assist with bed mobility and transfers and was only able to ambulate 25' with a walker.  Her son feels that he will be able to manage pt when she is discharged, so I will recommend HHPT at that time.    Follow Up Recommendations Home health PT    Equipment Recommendations  None recommended by PT    Recommendations for Other Services   OT eval   Precautions / Restrictions Precautions Precautions: Fall Restrictions Weight Bearing Restrictions: No      Mobility  Bed Mobility Overal bed mobility: Needs Assistance Bed Mobility: Supine to Sit     Supine to sit: Min assist        Transfers Overall transfer level: Needs assistance Equipment used: Rolling walker (2 wheeled) Transfers: Sit to/from Stand Sit to Stand: Min assist         General transfer comment: cues needed to push up from mattress rather than reach for walker  Ambulation/Gait Ambulation/Gait assistance: Supervision Ambulation Distance (Feet): 25 Feet Assistive device: Rolling walker (2 wheeled) Gait Pattern/deviations: Trunk flexed   Gait velocity interpretation: Below normal speed for age/gender General Gait Details: gait is quite labored but stable ...cues to maintain herself within the walker  Stairs            Wheelchair Mobility    Modified Rankin (Stroke Patients  Only)       Balance Overall balance assessment: No apparent balance deficits (not formally assessed)                                           Pertinent Vitals/Pain Pain Assessment: No/denies pain    Home Living Family/patient expects to be discharged to:: Private residence Living Arrangements: Spouse/significant other;Children Available Help at Discharge: Available 24 hours/day;Family Type of Home: House Home Access: Stairs to enter Entrance Stairs-Rails: None Entrance Stairs-Number of Steps: 2 Home Layout: One level Home Equipment: Environmental consultant - 2 wheels;Cane - single point;Wheelchair - manual      Prior Function Level of Independence: Independent with assistive device(s)         Comments: uses a cane in the house and a walker outside of the home     Hand Dominance   Dominant Hand: Right    Extremity/Trunk Assessment   Upper Extremity Assessment: Defer to OT evaluation           Lower Extremity Assessment: Generalized weakness      Cervical / Trunk Assessment: Kyphotic (pt stands with full trunk flexion due to spinal disease)  Communication      Cognition Arousal/Alertness: Awake/alert Behavior During Therapy: WFL for tasks assessed/performed Overall Cognitive Status: Within Functional Limits for tasks assessed  General Comments      Exercises        Assessment/Plan    PT Assessment Patient needs continued PT services  PT Diagnosis Difficulty walking;Generalized weakness   PT Problem List Decreased strength;Decreased activity tolerance;Decreased mobility  PT Treatment Interventions Gait training;Functional mobility training;Therapeutic exercise;Patient/family education   PT Goals (Current goals can be found in the Care Plan section) Acute Rehab PT Goals Patient Stated Goal: wants to get stronger PT Goal Formulation: With patient/family Time For Goal Achievement: 07/06/14 Potential to Achieve Goals:  Good    Frequency Min 3X/week   Barriers to discharge        Co-evaluation               End of Session Equipment Utilized During Treatment: Gait belt Activity Tolerance: Patient limited by fatigue Patient left: in chair;with call bell/phone within reach;with chair alarm set Nurse Communication: Mobility status         Time: 1914-7829 PT Time Calculation (min): 41 min   Charges:   PT Evaluation $Initial PT Evaluation Tier I: 1 Procedure PT Treatments $Gait Training: 8-22 mins   PT G CodesSable Feil 06/22/2014, 12:32 PM

## 2014-06-22 NOTE — Care Management Note (Addendum)
    Page 1 of 2   06/24/2014     11:34:05 AM CARE MANAGEMENT NOTE 06/24/2014  Patient:  [name], [name]   Account Number:  [id number]  Date Initiated:  06/22/2014  Documentation initiated by:  Theophilus Kinds  Subjective/Objective Assessment:   Pt admitted from home with hyponatremia. Pt lives with her husband and will return home at discharge. Pt has a cane and walker for home use. Pt is fairly independent with ADL's considering her age.     Action/Plan:   PT consult. Will continue to follow for discharge planning needs.   Anticipated DC Date:  06/25/2014   Anticipated DC Plan:  [place] [facility]  CM consult      [facility] Choice  HOME HEALTH   Choice offered to / List presented to:  C-1 Patient        [place] arranged  [place] [facility]      [place].   Status of service:  Completed, signed off Medicare Important Message given?  YES (If response is "NO", the following Medicare IM given date fields will be blank) Date Medicare IM given:  06/24/2014 Medicare IM given by:  Theophilus Kinds Date Additional Medicare IM given:   Additional Medicare IM given by:    Discharge Disposition:  [place]  Per UR Regulation:    If discussed at Long Length of Stay Meetings, dates discussed:    Comments:  06/24/14 [place], RN BSN CM Pt discharged home today with [facility] PT (per pts choice). Romualdo Bolk of [facility] is aware and will collect the pts information from the chart. [place] services to start within 48 hours of discharge. No DME needs noted. Pt and pts nurse aware of discharge arrangements.  06/23/14 [place], RN BSN CM Pt choses [facility] for PT. Romualdo Bolk of [facility] is aware and will collect the pts information from the chart. [place] services to start within 48 hours of discharge. No DME needs noted. Pt and pts nurse aware of discharge arrangements.  06/22/14 [place], RN BSN CM

## 2014-06-22 NOTE — Progress Notes (Signed)
PROGRESS NOTE  [name] TIW:[id number] DOB: May 05, 1925 DOA: 06/21/2014 PCP: Delphina Cahill, MD  Summary: 80 year old woman treated as an outpatient for pneumonia initially with Levaquin and then Augmentin. She has had persistent nausea and poor oral intake but has continued to take her diuretic. She was admitted for hyponatremia.  Assessment/Plan: 1. Hypoosmolar hyponatremia, symptomatic with nausea. Improving with IV fluids. 2. Dehydration. Secondary to acute illness, poor oral intake; complicated by hydrochlorothiazide. 3. Outpatient diagnosis of pneumonia treated with Levaquin, then Augmentin. Consider bronchitis, appears resolved. 4. Low TSH. On Synthroid. Consider reducing the dose. Defer to primary care physician.   Steadily improving with fluids. Continue hydration and withhold hydrochlorothiazide which should correct hypoosmolar state.  Followup TSH as an outpatient  Anticipate home with home health in the next 48 hours and sodium improves.  Code Status: full code DVT prophylaxis: Lovenox Family Communication: son and daughter Disposition Plan: home with HHPT  Murray Hodgkins, MD  [facility]  Pager [phone] If 7PM-7AM, please contact night-coverage at [email or web address], password [email or web address] 06/22/2014, 7:09 PM  LOS: 1 day   Consultants:  Physical therapy: Home health  Procedures:    Antibiotics:    HPI/Subjective: Feeling about 20% better. Tolerating diet. No bowel pain. No nausea or vomiting. No focal complaints.  Objective: Filed Vitals:   06/22/14 0654 06/22/14 0814 06/22/14 1433 06/22/14 1446  BP: 152/65   165/47  Pulse: 71   66  Temp: 98.1 F (36.7 C)   98.6 F (37 C)  TempSrc: Oral   Oral  Resp: 18   18  Height:      Weight:      SpO2:  95% 98% 100%    Intake/Output Summary (Last 24 hours) at 06/22/14 1909 Last data filed at 06/22/14 1700  Gross per 24 hour  Intake   1640 ml  Output      0 ml  Net   1640 ml     Filed Weights   06/21/14  1356 06/21/14 1919  Weight: 56.7 kg (125 lb) 56.7 kg (125 lb)    Exam:     Afebrile, vital signs stable. No hypoxia.  General: Appears calm, comfortable.  Psych: Alert. Speech fluent and clear.  CV: Regular rate and rhythm. No murmur, rub or gallop. No lower extremity edema.  Respiratory: Clear to auscultation bilaterally. No wheezes, rales or rhonchi. Normal respiratory effort.  Abdomen: Soft, nontender, nondistended  Data Reviewed:  Sodium improved to 116 >> 122 >> 123. Chloride improving. Basic metabolic panel otherwise unremarkable.  Hemoglobin stable.  TSH level 0.281.  Chest x-ray no acute disease.  Scheduled Meds: . albuterol  2.5 mg Nebulization Q6H  . aspirin EC  81 mg Oral BID  . benzonatate  200 mg Oral BID  . enoxaparin (LOVENOX) injection  40 mg Subcutaneous Q24H  . gabapentin  100-300 mg Oral QHS  . guaiFENesin  600 mg Oral BID  . levothyroxine  50 mcg Oral QAC breakfast  . metoprolol  50 mg Oral Daily  . pantoprazole  40 mg Oral Daily  . simvastatin  10 mg Oral q1800  . sodium chloride  3 mL Intravenous Q12H   Continuous Infusions: . sodium chloride 100 mL/hr at 06/21/14 2135    Principal Problem:   Hyponatremia Active Problems:   Generalized weakness   Dehydration   Acute bronchitis   Fatigue   GERD without esophagitis   Benign essential HTN   Hypothyroidism   Hyperlipidemia   Time spent 20 minutes

## 2014-06-23 DIAGNOSIS — E785 Hyperlipidemia, unspecified: Secondary | ICD-10-CM

## 2014-06-23 LAB — BASIC METABOLIC PANEL
Anion gap: 10 (ref 5–15)
BUN: 9 mg/dL (ref 6–23)
CO2: 23 mEq/L (ref 19–32)
CREATININE: 0.66 mg/dL (ref 0.50–1.10)
Calcium: 8.6 mg/dL (ref 8.4–10.5)
Chloride: 96 mEq/L (ref 96–112)
GFR calc non Af Amer: 77 mL/min — ABNORMAL LOW (ref >90)
GFR, EST AFRICAN AMERICAN: 89 mL/min — AB (ref >90)
GLUCOSE: 102 mg/dL — AB (ref 70–99)
Potassium: 4.4 mEq/L (ref 3.7–5.3)
Sodium: 129 mEq/L — ABNORMAL LOW (ref 137–147)

## 2014-06-23 MED ORDER — LORATADINE 10 MG PO TABS
10.0000 mg | ORAL_TABLET | Freq: Every day | ORAL | Status: DC
  Administered 2014-06-23 – 2014-06-24 (×2): 10 mg via ORAL
  Filled 2014-06-23 (×2): qty 1

## 2014-06-23 MED ORDER — DOCUSATE SODIUM 100 MG PO CAPS
100.0000 mg | ORAL_CAPSULE | Freq: Two times a day (BID) | ORAL | Status: DC
  Administered 2014-06-23 – 2014-06-24 (×3): 100 mg via ORAL
  Filled 2014-06-23 (×3): qty 1

## 2014-06-23 MED ORDER — POLYETHYLENE GLYCOL 3350 17 G PO PACK
17.0000 g | PACK | Freq: Every day | ORAL | Status: DC
  Administered 2014-06-23 – 2014-06-24 (×2): 17 g via ORAL
  Filled 2014-06-23 (×2): qty 1

## 2014-06-23 MED ORDER — FLUTICASONE PROPIONATE 50 MCG/ACT NA SUSP
2.0000 | Freq: Every day | NASAL | Status: DC
  Administered 2014-06-23 – 2014-06-24 (×2): 2 via NASAL
  Filled 2014-06-23: qty 16

## 2014-06-23 NOTE — Progress Notes (Signed)
[facility]                                                                              Patient Demographics  [name], is a 80 y.o. female, DOB - 1925-02-13, UXN:[id number]  Admit date - 06/21/2014   Admitting Physician Kathie Dike, MD  Outpatient Primary MD for the patient is Delphina Cahill, MD  LOS - 2   Chief Complaint  Patient presents with  . Weakness  . Cough      HPI on 06/21/2014 [name] is a 80 y.o. female who had initially presented to her primary care physician on 9/22 with complaints of productive cough and generalized weakness. It was felt that she may have pneumonia and she was started on a course of Levaquin. With antibiotics, patient had significant nausea and had decreased by mouth intake. She continued to take the rest of her medications including her diuretics. She went back to her primary care physician due to persistent nausea, her antibiotics were changed to Augmentin. She was also given a prescription for Zofran. Unfortunately, she became increasingly fatigued and generally weak. Her family noted that she was becoming somewhat somnolent. She was brought to the emergency room for evaluation where she was found to have serum sodium of 116. Chest x-ray did not show any underlying pneumonia. She did receive a breathing treatment which her family reported that greatly improved her cough and shortness of breath. Patient was admitted for further treatment of hyponatremia.   Assessment & Plan   Hyperosmolar hyponatremia -Sodium on admission was 116, today 129 -Continue IV fluids -Likely secondary to diuretic use and poor oral intake -TSH 0.281  Dehydration with poor oral intake -Secondary to acute, as complicated by diuretic use -Improving  Community-acquired pneumonia versus bronchitis -Patient was started on Levaquin for pneumonia as an outpatient and then changed to Augmentin -Appears to have resolved  Hypothyroidism -TSH 0.281 -Continue  Synthroid -Patient will need followup with her primary care physician to titrate her medications  Constipation -Will start patient on MiraLAX as well as Colace  Nasal congestion -Will start patient on Flonase nasal spray  Code Status: Full  Family Communication: Daughter at bedside  Disposition Plan: Admitted, likely will be discharged on 06/24/2014  Time Spent in minutes   30 minutes  Procedures  None  Consults   None  DVT Prophylaxis  Lovenox  Lab Results  Component Value Date   PLT 258 06/22/2014    Medications  Scheduled Meds: . albuterol  2.5 mg Nebulization Q6H  . aspirin EC  81 mg Oral BID  . benzonatate  200 mg Oral BID  . docusate sodium  100 mg Oral BID  . enoxaparin (LOVENOX) injection  40 mg Subcutaneous Q24H  . fluticasone  2 spray Each Nare Daily  . gabapentin  100-300 mg Oral QHS  . guaiFENesin  600 mg Oral BID  . levothyroxine  50 mcg Oral QAC breakfast  . loratadine  10 mg Oral Daily  . metoprolol  50 mg Oral Daily  . pantoprazole  40 mg Oral Daily  . polyethylene glycol  17 g Oral Daily  . simvastatin  10 mg Oral q1800  . sodium  chloride  3 mL Intravenous Q12H   Continuous Infusions: . sodium chloride 75 mL/hr at 06/22/14 2335   PRN Meds:.acetaminophen, acetaminophen, albuterol, ondansetron (ZOFRAN) IV, ondansetron  Antibiotics    Anti-infectives   None        Subjective:   Mckinzie Doty seen and examined today.  Patient states she continues to feel weak however feels that she is somewhat improved as compared to previous days. She currently denies any headache, dizziness, shortness of breath, chest pain, abdominal pain. Patient does state that she feels that she has nasal stuffiness as well as constipation.  Objective:   Filed Vitals:   06/23/14 0159 06/23/14 0647 06/23/14 0659 06/23/14 1011  BP:   143/45 145/44  Pulse:   68 77  Temp:   98.2 F (36.8 C)   TempSrc:   Oral   Resp:   20   Height:      Weight:      SpO2: 96%  98% 98%     Wt Readings from Last 3 Encounters:  06/21/14 56.7 kg (125 lb)     Intake/Output Summary (Last 24 hours) at 06/23/14 1249 Last data filed at 06/23/14 1035  Gross per 24 hour  Intake   1851 ml  Output    275 ml  Net   1576 ml    Exam  General: Well developed, well nourished, NAD, appears stated age  HEENT: NCAT, PERRLA, EOMI, Anicteic Sclera, mucous membranes moist.   Cardiovascular: S1 S2 auscultated, no rubs, murmurs or gallops. Regular rate and rhythm.  Respiratory: Clear to auscultation bilaterally with equal chest rise  Abdomen: Soft, nontender, nondistended, + bowel sounds  Extremities: warm dry without cyanosis clubbing or edema  Neuro: AAOx3, no focal deficits  Psych: Normal affect and demeanor with intact judgement and insight  Data Review   Micro Results No results found for this or any previous visit (from the past 240 hour(s)).  Radiology Reports Dg Chest 2 View  06/21/2014   CLINICAL DATA:  Weakness. Cough. Congestion. Shortness of breath. Fever.  EXAM: CHEST  2 VIEW  COMPARISON:  None.  FINDINGS: Prior CABG. Mild biapical pleural parenchymal scarring. Chronic appearing compression fracture at the thoracolumbar junction if compatible with osteoporosis.  Heart size within normal limits. The lungs appear clear. No pleural effusion.  IMPRESSION: 1. No acute findings. 2. Remote compression fracture at the thoracolumbar junction.   Electronically Signed   By: Sherryl Barters M.D.   On: 06/21/2014 15:50    CBC  Recent Labs Lab 06/21/14 1411 06/21/14 2140 06/22/14 0539  WBC 9.2 6.2 7.1  HGB 12.2 11.5* 11.3*  HCT 33.7* 31.6* 31.2*  PLT 275 251 258  MCV 84.3 84.0 84.8  MCH 30.5 30.6 30.7  MCHC 36.2* 36.4* 36.2*  RDW 12.7 12.9 12.9  LYMPHSABS 0.8  --   --   MONOABS 0.8  --   --   EOSABS 0.1  --   --   BASOSABS 0.0  --   --     Chemistries   Recent Labs Lab 06/21/14 1427 06/21/14 2140 06/22/14 0539 06/22/14 1437 06/22/14 2134  06/23/14 0523  NA 116*  --  122* 123* 125* 129*  K 4.9  --  4.4 4.9 4.2 4.4  CL 80*  --  87* 90* 90* 96  CO2 25  --  22 23 23 23   GLUCOSE 125*  --  106* 100* 136* 102*  BUN 18  --  14 11 10 9   CREATININE 0.73  0.71 0.70 0.86 0.71 0.66  CALCIUM 9.3  --  8.6 8.5 8.5 8.6  AST 28  --   --   --   --   --   ALT 17  --   --   --   --   --   ALKPHOS 82  --   --   --   --   --   BILITOT 0.4  --   --   --   --   --    ------------------------------------------------------------------------------------------------------------------ estimated creatinine clearance is 41 ml/min (by C-G formula based on Cr of 0.66). ------------------------------------------------------------------------------------------------------------------ No results found for this basename: HGBA1C,  in the last 72 hours ------------------------------------------------------------------------------------------------------------------ No results found for this basename: CHOL, HDL, LDLCALC, TRIG, CHOLHDL, LDLDIRECT,  in the last 72 hours ------------------------------------------------------------------------------------------------------------------  Recent Labs  06/21/14 2140  TSH 0.281*   ------------------------------------------------------------------------------------------------------------------ No results found for this basename: VITAMINB12, FOLATE, FERRITIN, TIBC, IRON, RETICCTPCT,  in the last 72 hours  Coagulation profile No results found for this basename: INR, PROTIME,  in the last 168 hours  No results found for this basename: DDIMER,  in the last 72 hours  Cardiac Enzymes No results found for this basename: CK, CKMB, TROPONINI, MYOGLOBIN,  in the last 168 hours ------------------------------------------------------------------------------------------------------------------ No components found with this basename: POCBNP,     Yecenia Dalgleish D.O. on 06/23/2014 at 12:49 PM  Between 7am to 7pm - Pager -  [phone]  After 7pm go to [email or web address] - password [email or web address]  And look for the night coverage person covering for me after hours  [facility] Office  [phone]

## 2014-06-24 DIAGNOSIS — J40 Bronchitis, not specified as acute or chronic: Secondary | ICD-10-CM

## 2014-06-24 LAB — BASIC METABOLIC PANEL
Anion gap: 10 (ref 5–15)
BUN: 8 mg/dL (ref 6–23)
CO2: 25 mEq/L (ref 19–32)
CREATININE: 0.66 mg/dL (ref 0.50–1.10)
Calcium: 8.6 mg/dL (ref 8.4–10.5)
Chloride: 101 mEq/L (ref 96–112)
GFR calc Af Amer: 89 mL/min — ABNORMAL LOW (ref >90)
GFR calc non Af Amer: 77 mL/min — ABNORMAL LOW (ref >90)
GLUCOSE: 92 mg/dL (ref 70–99)
POTASSIUM: 4.6 meq/L (ref 3.7–5.3)
Sodium: 136 mEq/L — ABNORMAL LOW (ref 137–147)

## 2014-06-24 MED ORDER — DSS 100 MG PO CAPS: 100.0000 mg | ORAL_CAPSULE | Freq: Two times a day (BID) | ORAL | Status: DC

## 2014-06-24 MED ORDER — POLYETHYLENE GLYCOL 3350 17 G PO PACK: 17.0000 g | PACK | Freq: Every day | ORAL | Status: DC

## 2014-06-24 MED ORDER — ALBUTEROL SULFATE (2.5 MG/3ML) 0.083% IN NEBU: 2.5000 mg | INHALATION_SOLUTION | Freq: Two times a day (BID) | RESPIRATORY_TRACT | Status: DC

## 2014-06-24 MED ORDER — LORATADINE 10 MG PO TABS: 10.0000 mg | ORAL_TABLET | Freq: Every day | ORAL | Status: DC

## 2014-06-24 MED ORDER — FLUTICASONE PROPIONATE 50 MCG/ACT NA SUSP: 2.0000 | Freq: Every day | NASAL | Status: DC

## 2014-06-24 MED ORDER — BENZONATATE 200 MG PO CAPS: 200.0000 mg | ORAL_CAPSULE | Freq: Two times a day (BID) | ORAL | Status: DC

## 2014-06-24 NOTE — Discharge Instructions (Signed)
Hyponatremia  °Hyponatremia is when the amount of salt (sodium) in your blood is too low. When sodium levels are low, your cells will absorb extra water and swell. The swelling happens throughout the body, but it mostly affects the brain. Severe brain swelling (cerebral edema), seizures, or coma can happen.  °CAUSES  °· Heart, kidney, or liver problems. °· Thyroid problems. °· Adrenal gland problems. °· Severe vomiting and diarrhea. °· Certain medicines or illegal drugs. °· Dehydration. °· Drinking too much water. °· Low-sodium diet. °SYMPTOMS  °· Nausea and vomiting. °· Confusion. °· Lethargy. °· Agitation. °· Headache. °· Twitching or shaking (seizures). °· Unconsciousness. °· Appetite loss. °· Muscle weakness and cramping. °DIAGNOSIS  °Hyponatremia is identified by a simple blood test. Your caregiver will perform a history and physical exam to try to find the cause and type of hyponatremia. Other tests may be needed to measure the amount of sodium in your blood and urine. °TREATMENT  °Treatment will depend on the cause.  °· Fluids may be given through the vein (IV). °· Medicines may be used to correct the sodium imbalance. If medicines are causing the problem, they will need to be adjusted. °· Water or fluid intake may be restricted to restore proper balance. °The speed of correcting the sodium problem is very important. If the problem is corrected too fast, nerve damage (sometimes unchangeable) can happen. °HOME CARE INSTRUCTIONS  °· Only take medicines as directed by your caregiver. Many medicines can make hyponatremia worse. Discuss all your medicines with your caregiver. °· Carefully follow any recommended diet, including any fluid restrictions. °· You may be asked to repeat lab tests. Follow these directions. °· Avoid alcohol and recreational drugs. °SEEK MEDICAL CARE IF:  °· You develop worsening nausea, fatigue, headache, confusion, or weakness. °· Your original hyponatremia symptoms return. °· You have  problems following the recommended diet. °SEEK IMMEDIATE MEDICAL CARE IF:  °· You have a seizure. °· You faint. °· You have ongoing diarrhea or vomiting. °MAKE SURE YOU:  °· Understand these instructions. °· Will watch your condition. °· Will get help right away if you are not doing well or get worse. °Document Released: 09/08/2002 Document Revised: 12/11/2011 Document Reviewed: 03/05/2011 °[employer]® Patient Information ©2015 [employer]. This information is not intended to replace advice given to you by your health care provider. Make sure you discuss any questions you have with your health care provider. ° °

## 2014-06-24 NOTE — Discharge Summary (Signed)
Physician Discharge Summary  [name] ZOX:[id number] DOB: 1924-11-01 DOA: 06/21/2014  PCP: Delphina Cahill, MD  Admit date: 06/21/2014 Discharge date: 06/24/2014  Time spent: 45 minutes  Recommendations for Outpatient Follow-up:  Patient will be discharged home with home health physical therapy. She is to follow up with her primary care physician within one week of discharge. Patient should also have a repeat BMP at that time. Patient to continue her medications as prescribed.  Discharge Diagnoses:  Hyperosmolar hyponatremia Dehydration with poor oral intake/generalized weakness Community acquired pneumonia versus bronchitis Hypothyroidism Constipation Nasal congestion  Discharge Condition: Stable  Diet recommendation: Heart healthy  Filed Weights   06/21/14 1356 06/21/14 1919  Weight: 56.7 kg (125 lb) 56.7 kg (125 lb)    History of present illness:  on 06/21/2014  [name] is a 80 y.o. female who had initially presented to her primary care physician on 9/22 with complaints of productive cough and generalized weakness. It was felt that she may have pneumonia and she was started on a course of Levaquin. With antibiotics, patient had significant nausea and had decreased by mouth intake. She continued to take the rest of her medications including her diuretics. She went back to her primary care physician due to persistent nausea, her antibiotics were changed to Augmentin. She was also given a prescription for Zofran. Unfortunately, she became increasingly fatigued and generally weak. Her family noted that she was becoming somewhat somnolent. She was brought to the emergency room for evaluation where she was found to have serum sodium of 116. Chest x-ray did not show any underlying pneumonia. She did receive a breathing treatment which her family reported that greatly improved her cough and shortness of breath. Patient was admitted for further treatment of hyponatremia.  Hospital Course:    Hyperosmolar hyponatremia  -Sodium on admission was 116, today 136 -Likely secondary to diuretic use and poor oral intake  -Patient responded well to IV fluids. -TSH 0.281   Dehydration with poor oral intake/generalized weakness  -Secondary to acute, as complicated by diuretic use  -Resolved -PT was consulted, and recommended home health with physical therapy -Patient was instructed to not use her diuretics if she has poor oral intake. This was discussed in front of her daughter at bedside.  Community-acquired pneumonia versus bronchitis  -Patient was started on Levaquin for pneumonia as an outpatient and then changed to Augmentin  -Appears to have resolved  -Chest x-ray on 06/21/2014 showed no acute findings.  Hypothyroidism  -TSH 0.281  -Continue Synthroid  -Patient will need followup with her primary care physician to titrate her medications   Constipation  -Continue MiraLAX as well as Colace   Nasal congestion  -Continue Flonase nasal spray  Hypertension -Resumed home medications at discharge  Procedures:  None  Consultations:  None  Discharge Exam: Filed Vitals:   06/24/14 0636  BP: 130/33  Pulse: 71  Temp: 97.2 F (36.2 C)  Resp: 20   Exam  General: Well developed, well nourished, NAD, appears stated age  HEENT: NCAT, mucous membranes moist.  Cardiovascular: S1 S2 auscultated, no rubs, murmurs or gallops. Regular rate and rhythm.  Respiratory: Clear to auscultation bilaterally with equal chest rise, occasional cough  Abdomen: Soft, nontender, nondistended, + bowel sounds  Neuro: AAOx3, no focal deficits  Psych: Normal affect and demeanor with intact judgement and insight  Discharge Instructions      Discharge Instructions   Discharge instructions    Complete by:  As directed   Patient will be discharged  home with home health physical therapy. She is to follow up with her primary care physician within one week of discharge. Patient should also  have a repeat BMP at that time. Patient to continue her medications as prescribed.     Increase activity slowly    Complete by:  As directed             Medication List    STOP taking these medications       amoxicillin-clavulanate 500-125 MG per tablet  Commonly known as:  AUGMENTIN     levofloxacin 500 MG tablet  Commonly known as:  LEVAQUIN      TAKE these medications       aspirin EC 81 MG tablet  Take 81 mg by mouth 2 (two) times daily.     benzonatate 200 MG capsule  Commonly known as:  TESSALON  Take 1 capsule (200 mg total) by mouth 2 (two) times daily.     DEXILANT 60 MG capsule  Generic drug:  dexlansoprazole  Take 60 mg by mouth daily.     dextromethorphan-guaiFENesin 30-600 MG per 12 hr tablet  Commonly known as:  MUCINEX DM  Take 1 tablet by mouth 2 (two) times daily.     DSS 100 MG Caps  Take 100 mg by mouth 2 (two) times daily.     fluticasone 50 MCG/ACT nasal spray  Commonly known as:  FLONASE  Place 2 sprays into both nostrils daily.     furosemide 20 MG tablet  Commonly known as:  LASIX  Take 20 mg by mouth.     gabapentin 100 MG capsule  Commonly known as:  NEURONTIN  Take 100-300 mg by mouth at bedtime.     levothyroxine 50 MCG tablet  Commonly known as:  SYNTHROID, LEVOTHROID  Take 50 mcg by mouth daily before breakfast.     loratadine 10 MG tablet  Commonly known as:  CLARITIN  Take 1 tablet (10 mg total) by mouth daily.     losartan-hydrochlorothiazide 100-12.5 MG per tablet  Commonly known as:  HYZAAR  Take 1 tablet by mouth daily.     lovastatin 20 MG tablet  Commonly known as:  MEVACOR  Take 20 mg by mouth daily.     Magnesium 250 MG Tabs  Take 1 tablet by mouth daily.     metoprolol 50 MG tablet  Commonly known as:  LOPRESSOR  Take 50 mg by mouth daily.     naproxen sodium 220 MG tablet  Commonly known as:  ANAPROX  Take 220 mg by mouth at bedtime.     ondansetron 4 MG tablet  Commonly known as:  ZOFRAN  Take 4  mg by mouth every 8 (eight) hours as needed for nausea or vomiting.     polyethylene glycol packet  Commonly known as:  MIRALAX / GLYCOLAX  Take 17 g by mouth daily.     potassium chloride 10 MEQ tablet  Commonly known as:  K-DUR  Take 10 mEq by mouth daily.     terbinafine 250 MG tablet  Commonly known as:  LAMISIL  Take 250 mg by mouth daily.     triamcinolone cream 0.1 %  Commonly known as:  KENALOG  Apply 1 application topically 2 (two) times daily.       Allergies  Allergen Reactions  . Levaquin [Levofloxacin In D5w]     nausea  . Sulfa Antibiotics Rash   Follow-up Information   Follow up with [facility].   Contact information:   [address] [place] [address] [phone]       Follow up with Delphina Cahill, MD. Schedule an appointment as soon as possible for a visit in 1 week. [facility] followup, repeat BMP)    Specialty:  Internal Medicine   Contact information:    [place] [address] [phone]        The results of significant diagnostics from this hospitalization (including imaging, microbiology, ancillary and laboratory) are listed below for reference.    Significant Diagnostic Studies: Dg Chest 2 View  06/21/2014   CLINICAL DATA:  Weakness. Cough. Congestion. Shortness of breath. Fever.  EXAM: CHEST  2 VIEW  COMPARISON:  None.  FINDINGS: Prior CABG. Mild biapical pleural parenchymal scarring. Chronic appearing compression fracture at the thoracolumbar junction if compatible with osteoporosis.  Heart size within normal limits. The lungs appear clear. No pleural effusion.  IMPRESSION: 1. No acute findings. 2. Remote compression fracture at the thoracolumbar junction.   Electronically Signed   By: Sherryl Barters M.D.   On: 06/21/2014 15:50    Microbiology: No results found for this or any previous visit (from the past 240 hour(s)).   Labs: Basic Metabolic Panel:  Recent Labs Lab 06/22/14 0539  06/22/14 1437 06/22/14 2134 06/23/14 0523 06/24/14 0537  NA 122* 123* 125* 129* 136*  K 4.4 4.9 4.2 4.4 4.6  CL 87* 90* 90* 96 101  CO2 22 23 23 23 25   GLUCOSE 106* 100* 136* 102* 92  BUN 14 11 10 9 8   CREATININE 0.70 0.86 0.71 0.66 0.66  CALCIUM 8.6 8.5 8.5 8.6 8.6   Liver Function Tests:  Recent Labs Lab 06/21/14 1427  AST 28  ALT 17  ALKPHOS 82  BILITOT 0.4  PROT 6.8  ALBUMIN 3.6   No results found for this basename: LIPASE, AMYLASE,  in the last 168 hours No results found for this basename: AMMONIA,  in the last 168 hours CBC:  Recent Labs Lab 06/21/14 1411 06/21/14 2140 06/22/14 0539  WBC 9.2 6.2 7.1  NEUTROABS 7.4  --   --   HGB 12.2 11.5* 11.3*  HCT 33.7* 31.6* 31.2*  MCV 84.3 84.0 84.8  PLT 275 251 258   Cardiac Enzymes: No results found for this basename: CKTOTAL, CKMB, CKMBINDEX, TROPONINI,  in the last 168 hours BNP: BNP (last 3 results)  Recent Labs  06/21/14 1427  PROBNP 272.6   CBG: No results found for this basename: GLUCAP,  in the last 168 hours     Signed:  Cristal Ford  [facility] 06/24/2014, 11:44 AM

## 2014-06-24 NOTE — Progress Notes (Signed)
Patient's IV was removed and was clean, dry, and intact at removal.  Patient and family received discharge instructions and had no further questions/concerns.  Patient was able to verbalize understanding of discharge instructions and follow-up appointments.  Patient was escorted to vehicle via wheelchair by nurse tech.

## 2014-08-05 ENCOUNTER — Encounter: Payer: Self-pay | Admitting: Cardiovascular Disease

## 2014-08-05 ENCOUNTER — Ambulatory Visit (INDEPENDENT_AMBULATORY_CARE_PROVIDER_SITE_OTHER): Payer: Medicare HMO | Admitting: Cardiovascular Disease

## 2014-08-05 VITALS — BP 130/58 | HR 64 | Ht 63.0 in | Wt 121.9 lb

## 2014-08-05 DIAGNOSIS — I10 Essential (primary) hypertension: Secondary | ICD-10-CM

## 2014-08-05 DIAGNOSIS — E785 Hyperlipidemia, unspecified: Secondary | ICD-10-CM

## 2014-08-05 DIAGNOSIS — R079 Chest pain, unspecified: Secondary | ICD-10-CM

## 2014-08-05 DIAGNOSIS — I25700 Atherosclerosis of coronary artery bypass graft(s), unspecified, with unstable angina pectoris: Secondary | ICD-10-CM

## 2014-08-05 DIAGNOSIS — I2510 Atherosclerotic heart disease of native coronary artery without angina pectoris: Secondary | ICD-10-CM

## 2014-08-05 NOTE — Progress Notes (Signed)
Patient ID: Charleston Poot, female   DOB: 1925/07/12, 80 y.o.   MRN: [id number]      Reason for office visit Possible unstable angina  Mrs. Bruno is a delightful 80 year old woman who is now 8 years status post bypass surgery performed for high-grade proximal left main coronary artery stenosis as well as severe stenoses in the mid LAD and mid RCA. She has normal left ventricular systolic  Until 4 weeks ago she had not taken sublingual nitroglycerin in over a year. She had to take 2 sublingual nitroglycerin tablets for chest discomfort occurring at rest 4 weeks ago and again 2 weeks ago. On both occasions she was simply sitting down doing word puzzles. The discomfort was relieved and roughly 10-15 minutes. She has not had exertional discomfort, but leads a relatively sedentary lifestyle. She will occasionally cooks for herself and wash dishes but does not do much housework.  Otherwise she has been feeling well without major complaints.  Allergies  Allergen Reactions  . Aspirin     REACTION: High dose gives lip swelling  . Celecoxib     REACTION: GI upset  . Lipitor [Atorvastatin]   . Sulfonamide Derivatives     REACTION: Rash    Current Outpatient Prescriptions  Medication Sig Dispense Refill  . aspirin 81 MG tablet Take 81 mg by mouth 2 (two) times daily.     . ciprofloxacin (CIPRO) 250 MG tablet Take 1 tablet by mouth 2 (two) times daily.    Marland Kitchen DEXILANT 60 MG capsule Take 60 mg by mouth daily.     . furosemide (LASIX) 20 MG tablet Take 1 tablet by mouth as needed.    . gabapentin (NEURONTIN) 100 MG capsule Take 1 capsule by mouth daily.    Marland Kitchen HYDROcodone-homatropine (HYCODAN) 5-1.5 MG/5ML syrup Take 5 mLs by mouth every 6 (six) hours as needed for cough.    . levothyroxine (SYNTHROID, LEVOTHROID) 50 MCG tablet Take 1 tablet by mouth daily.    Marland Kitchen losartan-hydrochlorothiazide (HYZAAR) 100-12.5 MG per tablet Take 30 tablets by mouth daily. 30 tablet 6  . lovastatin (MEVACOR) 20 MG tablet  Take 20 mg by mouth at bedtime.    . metoprolol (LOPRESSOR) 50 MG tablet Take 1 tablet (50 mg total) by mouth daily. Take 1 tablet daily 30 tablet 6  . naproxen sodium (ANAPROX) 220 MG tablet Take 220 mg by mouth daily.    Marland Kitchen NITROSTAT 0.4 MG SL tablet as needed.    . potassium chloride (K-DUR) 10 MEQ tablet Take 1 tablet by mouth as needed.    Marland Kitchen Specialty Vitamins Products (MAGNESIUM, AMINO ACID CHELATE,) 133 MG tablet Take 1 tablet by mouth 2 (two) times daily.     No current facility-administered medications for this visit.    Past Medical History  Diagnosis Date  . CAD (coronary artery disease)     CABG 10/28/2005 LIMA to LAD,SVG to OM,SVG to distal RCA  . Hyperlipidemia   . Systemic hypertension   . Carotid atherosclerosis     minor    Past Surgical History  Procedure Laterality Date  . Coronary artery bypass graft  10/28/2005    LIMA to LAD,SVG to OM,SVG to distal RCA  . Cardiac catheterization  10/27/2005    left main and severe 2 vessel disease.  . Abdominal hysterectomy  1966  . Cataract extraction  1998 & 2004  . US echocardiography  08/01/2011    mild to mod TR, ca+ AOV leaflets, AOV mildly sclerotic  . Nm  myocar perf wall motion  01/11/2010    normal    Family History  Problem Relation Age of Onset  . Stroke Father   . Heart attack Sister   . Heart attack Brother   . Heart attack Brother   . Cancer Brother   . Cancer Brother   . Diabetes Brother   . Cancer Sister     History   Social History  . Marital Status: Married    Spouse Name: N/A    Number of Children: N/A  . Years of Education: N/A   Occupational History  . Not on file.   Social History Main Topics  . Smoking status: Never Smoker   . Smokeless tobacco: Not on file  . Alcohol Use: No  . Drug Use: No  . Sexual Activity: Not on file   Other Topics Concern  . Not on file   Social History Narrative  . No narrative on file    Review of systems: The patient specifically denies dyspnea  at rest or with exertion, orthopnea, paroxysmal nocturnal dyspnea, syncope, palpitations, focal neurological deficits, intermittent claudication, lower extremity edema, unexplained weight gain, cough, hemoptysis or wheezing.  The patient also denies abdominal pain, nausea, vomiting, dysphagia, diarrhea, constipation, polyuria, polydipsia, dysuria, hematuria, frequency, urgency, abnormal bleeding or bruising, fever, chills, unexpected weight changes, mood swings, change in skin or hair texture, change in voice quality, auditory or visual problems, allergic reactions or rashes, new musculoskeletal complaints other than usual "aches and pains".   PHYSICAL EXAM BP 130/58 mmHg  Pulse 64  Ht $R'5\' 3"'HY$  (1.6 m)  Wt 121 lb 14.4 oz (55.293 kg)  BMI 21.60 kg/m2 Prominent thoracic kyphosis General: Alert, oriented x3, no distress Head: no evidence of trauma, PERRL, EOMI, no exophtalmos or lid lag, no myxedema, no xanthelasma; normal ears, nose and oropharynx Neck: normal jugular venous pulsations and no hepatojugular reflux; brisk carotid pulses without delay and no carotid bruits Chest: clear to auscultation, no signs of consolidation by percussion or palpation, normal fremitus, symmetrical and full respiratory excursions Cardiovascular: normal position and quality of the apical impulse, regular rhythm, normal first and widely split second heart sounds, no rubs or gallops, rate 2/6 early peaking systolic ejection murmur in the aortic focus Abdomen: no tenderness or distention, no masses by palpation, no abnormal pulsatility or arterial bruits, normal bowel sounds, no hepatosplenomegaly Extremities: no clubbing, cyanosis or edema; 2+ radial, ulnar and brachial pulses bilaterally; 2+ right femoral, posterior tibial and dorsalis pedis pulses; 2+ left femoral, posterior tibial and dorsalis pedis pulses; no subclavian or femoral bruits Neurological: grossly nonfocal  EKG: normal sinus rhythm, right bundle branch  block with secondary repolarization abnormalities and downsloping ST segment depression from V2 through the 5, not much different from previous tracings  Lipid Panel checked more recently by primary care physician but not available at this time - 2014: Total cholesterol 156, triglycerides 77, HDL 51, LDL 90    Component Value Date/Time   CHOL 142 12/04/2008 0048   TRIG 96 12/04/2008 0048   HDL 54 12/04/2008 0048   CHOLHDL 2.6 Ratio 12/04/2008 0048   VLDL 19 12/04/2008 0048   LDLCALC 69 12/04/2008 0048    BMET    Component Value Date/Time   NA 141 12/23/2010 1100   K 4.2 12/23/2010 1100   CL 107 12/23/2010 1100   CO2 28 12/23/2010 1100   GLUCOSE 77 12/23/2010 1100   BUN 12 12/23/2010 1100   CREATININE 0.73 12/23/2010 1100   CALCIUM  9.0 12/23/2010 1100   GFRNONAA >60 12/23/2010 1100   GFRAA  12/23/2010 1100    >60        The eGFR has been calculated using the MDRD equation. This calculation has not been validated in all clinical situations. eGFR's persistently <60 mL/min signify possible Chronic Kidney Disease.     ASSESSMENT AND PLAN CAD She underwent bypass surgery for left main coronary artery disease in 2007 and has done well since that time. For the first time in a long time she has had to take nitroglycerin tablets and I have at least some concern that this may represent unstable angina pectoris. I have recommended that she have a [employer]. He should seek urgent medical attention if she develops chest discomfort that is not relieved after 3 sublingual nitroglycerin tablets, lasting more than 25-30 minutes.  HYPERLIPIDEMIA Excellent lipid profile on statin therapy  HYPERTENSION The pressure control is excellent  Orders Placed This Encounter  Procedures  . Myocardial Perfusion Imaging  . EKG 12-Lead   Meds ordered this encounter  Medications  . levothyroxine (SYNTHROID, LEVOTHROID) 50 MCG tablet    Sig: Take 1 tablet by mouth daily.  Marland Kitchen gabapentin  (NEURONTIN) 100 MG capsule    Sig: Take 1 capsule by mouth daily.  . potassium chloride (K-DUR) 10 MEQ tablet    Sig: Take 1 tablet by mouth as needed.  . furosemide (LASIX) 20 MG tablet    Sig: Take 1 tablet by mouth as needed.  . ciprofloxacin (CIPRO) 250 MG tablet    Sig: Take 1 tablet by mouth 2 (two) times daily.  Marland Kitchen NITROSTAT 0.4 MG SL tablet    Sig: as needed.  Marland Kitchen HYDROcodone-homatropine (HYCODAN) 5-1.5 MG/5ML syrup    Sig: Take 5 mLs by mouth every 6 (six) hours as needed for cough.    Holli Humbles, MD, [place] [phone] office [phone] pager

## 2014-08-05 NOTE — Patient Instructions (Signed)
Your physician has requested that you have a lexiscan myoview. For further information please visit [email or web address]. Please follow instruction sheet, as given.  Dr. Sallyanne Kuster recommends that you schedule a follow-up appointment in: ONE YEAR.

## 2014-08-12 ENCOUNTER — Telehealth (HOSPITAL_COMMUNITY): Payer: Self-pay

## 2014-08-12 NOTE — Telephone Encounter (Signed)
Encounter complete. 

## 2014-08-13 ENCOUNTER — Encounter (INDEPENDENT_AMBULATORY_CARE_PROVIDER_SITE_OTHER): Payer: Self-pay | Admitting: *Deleted

## 2014-08-14 ENCOUNTER — Ambulatory Visit (HOSPITAL_COMMUNITY)
Admission: RE | Admit: 2014-08-14 | Discharge: 2014-08-14 | Disposition: A | Discharge status: Home / Self Care | Payer: Medicare HMO | Source: Ambulatory Visit | Attending: Internal Medicine | Admitting: Internal Medicine

## 2014-08-14 DIAGNOSIS — R079 Chest pain, unspecified: Secondary | ICD-10-CM

## 2014-08-14 DIAGNOSIS — R5383 Other fatigue: Secondary | ICD-10-CM | POA: Insufficient documentation

## 2014-08-14 DIAGNOSIS — E785 Hyperlipidemia, unspecified: Secondary | ICD-10-CM | POA: Diagnosis not present

## 2014-08-14 DIAGNOSIS — I4510 Unspecified right bundle-branch block: Secondary | ICD-10-CM | POA: Insufficient documentation

## 2014-08-14 DIAGNOSIS — I10 Essential (primary) hypertension: Secondary | ICD-10-CM | POA: Diagnosis not present

## 2014-08-14 MED ORDER — AMINOPHYLLINE 25 MG/ML IV SOLN
75.0000 mg | Freq: Once | INTRAVENOUS | Status: AC
  Administered 2014-08-14: 75 mg via INTRAVENOUS

## 2014-08-14 MED ORDER — TECHNETIUM TC 99M SESTAMIBI GENERIC - CARDIOLITE
10.4000 | Freq: Once | INTRAVENOUS | Status: AC | PRN
  Administered 2014-08-14: 10 via INTRAVENOUS

## 2014-08-14 MED ORDER — REGADENOSON 0.4 MG/5ML IV SOLN
0.4000 mg | Freq: Once | INTRAVENOUS | Status: AC
  Administered 2014-08-14: 0.4 mg via INTRAVENOUS

## 2014-08-14 MED ORDER — TECHNETIUM TC 99M SESTAMIBI GENERIC - CARDIOLITE
31.2000 | Freq: Once | INTRAVENOUS | Status: AC | PRN
  Administered 2014-08-14: 31.2 via INTRAVENOUS

## 2014-08-14 NOTE — Procedures (Addendum)
[place] [address] [place] [address] [phone]  Cardiology Nuclear Med Study  [name] is a 80 y.o. female     MRN : [id number]     DOB: 04-22-1925  Procedure Date: 08/14/2014  Nuclear Med Background Indication for Stress Test:  Evaluation for Ischemia, Graft Patency and Post Hospital History:  CAD;CABG X3-10/28/2005;CERVICAL DISC DISEASE;DDD;SPINAL STENOSIS;Last NUC MPI on 01/11/2010-nonischemic;EF=73%;ECHO on 08/01/2011 Cardiac Risk Factors: Family History - CAD, Hypertension, Lipids, RBBB and Hyponatremia;carotid atherosclerosis  Symptoms:  Chest Pain and Fatigue   Nuclear Pre-Procedure Caffeine/Decaff Intake:  10:00pm NPO After: 8:00am   IV Site: R Forearm  IV 0.9% NS with Angio Cath:  22g  Chest Size (in):  n/a IV Started by: Rolene Course, RN  Height: 5' (1.524 m)  Cup Size: B  BMI:  Body mass index is 23.63 kg/(m^2). Weight:  121 lb (54.885 kg)   Tech Comments:  n/a    Nuclear Med Study 1 or 2 day study: 1 day  Stress Test Type:  [place] Provider:  Gwendel Hanson, MD   Resting Radionuclide: Technetium 36m Sestamibi  Resting Radionuclide Dose: 10.4 mCi   Stress Radionuclide:  Technetium 45m Sestamibi  Stress Radionuclide Dose: 31.2 mCi           Stress Protocol Rest HR: 62 Stress HR: 85  Rest BP: 148/73 Stress BP: 190/67  Exercise Time (min): n/a METS: n/a          Dose of Adenosine (mg):  n/a Dose of Lexiscan: 0.4 mg  Dose of Atropine (mg): n/a Dose of Dobutamine: n/a mcg/kg/min (at max HR)  Stress Test Technologist: Mellody Memos, CCT Nuclear Technologist: Imagene Riches, CNMT   Rest Procedure:  Myocardial perfusion imaging was performed at rest 45 minutes following the intravenous administration of Technetium 15m Sestamibi. Stress Procedure:  The patient received IV Lexiscan 0.4 mg over 15-seconds.  Technetium 63m Sestamibi injected at 30-seconds.  Patient  experienced shortness of breath, head pain and was administered 75 mg of Aminophylline IV. There were no significant changes with Lexiscan.  Quantitative spect images were obtained after a 45 minute delay.  Transient Ischemic Dilatation (Normal <1.22):  1.27  QGS EDV:  60 ml QGS ESV:  20 ml LV Ejection Fraction: 67%       Rest ECG: NSR-RBBB, nonspecific ST changes  Stress ECG: No significant ST segment change suggestive of ischemia.  QPS Raw Data Images:  Acquisition technically good; normal left ventricular size. Stress Images:  There is decreased uptake in the distal anterior wall and apex. Rest Images:  There is decreased uptake in the distal anterior wall and apex. Subtraction (SDS):  No evidence of ischemia.  Impression Exercise Capacity:  Lexiscan with no exercise. BP Response:  Normal blood pressure response. Clinical Symptoms:  There is dyspnea. ECG Impression:  No significant ST segment change suggestive of ischemia. Comparison with Prior Nuclear Study: Compared to report of 01/11/10, soft tissue attenuation is new.  Overall Impression:  Low risk stress nuclear study with a small, moderate intensity, fixed distal anterior and apical defect consistent with soft tissue attenuation; no ischemia.  LV Wall Motion:  NL LV Function; NL Wall Motion   Kirk Ruths, MD  08/14/2014 1:13 PM

## 2014-10-10 ENCOUNTER — Encounter: Payer: Self-pay | Admitting: Cardiovascular Disease

## 2015-08-19 ENCOUNTER — Ambulatory Visit: Payer: Medicare PPO | Admitting: Cardiovascular Disease

## 2015-09-02 ENCOUNTER — Encounter: Payer: Self-pay | Admitting: Cardiovascular Disease

## 2015-09-02 ENCOUNTER — Ambulatory Visit (INDEPENDENT_AMBULATORY_CARE_PROVIDER_SITE_OTHER): Payer: Medicare PPO | Admitting: Cardiovascular Disease

## 2015-09-02 VITALS — BP 118/65 | HR 65 | Resp 16 | Ht 60.0 in | Wt 120.4 lb

## 2015-09-02 DIAGNOSIS — E785 Hyperlipidemia, unspecified: Secondary | ICD-10-CM | POA: Diagnosis not present

## 2015-09-02 DIAGNOSIS — I10 Essential (primary) hypertension: Secondary | ICD-10-CM

## 2015-09-02 DIAGNOSIS — I25708 Atherosclerosis of coronary artery bypass graft(s), unspecified, with other forms of angina pectoris: Secondary | ICD-10-CM

## 2015-09-02 NOTE — Progress Notes (Addendum)
Patient ID: [name], female   DOB: 17-Sep-1925, 81 y.o.   MRN: [id number]     Cardiology Office Note   Date:  09/02/2015   ID:  [name], DOB [birth year], MRN [id number]  PCP:  Wende Neighbors, MD  Cardiologist:   Sanda Klein, MD   Chief Complaint  Patient presents with  . Follow-up    no chest pain, no shortness of breath, occassional edema, has pain in legs, occassional cramping in legs, no lightheadedness,no dizziness      History of Present Illness: [name] is a 81 y.o. female who presents for  Follow-up for coronary artery disease. She is now 9 years after bypass surgery performed for high-grade proximal left main coronary artery stenosis as well as severe stenoses in the mid LAD and mid RCA. She has preserved left ventricular systolic function, treated systemic hypertension and hypothyroidism and hyperlipidemia.   she remains free of angina pectoris but of course is quite sedentary. She seems to have worsening kyphosis but walks at a moderately brisk pace with a cane. She has not taking any nitroglycerin in the last year. She had a normal nuclear perfusion study in November 2015. She denies syncope, palpitations, edema, intermittent claudication or new focal neurological complaints.  She does complain of easy fatigue and lack of energy. Her dose of levothyroxine was decreased to half in the last year.  Past Medical History  Diagnosis Date  . Coronary artery disease   . Hypertension   . Cancer ([place])     vaginal  . CAD (coronary artery disease)     CABG 10/28/2005 LIMA to LAD,SVG to OM,SVG to distal RCA  . Hyperlipidemia   . Systemic hypertension   . Carotid atherosclerosis     minor    Past Surgical History  Procedure Laterality Date  . Coronary artery bypass graft    . Abdominal hysterectomy    . Cesarean section    . Cholecystectomy    . Coronary artery bypass graft  10/28/2005    LIMA to LAD,SVG to OM,SVG to distal RCA  . Cardiac catheterization  10/27/2005    left main and severe 2 vessel disease.  . Abdominal hysterectomy  1966  . Cataract extraction  1998 & 2004  . US echocardiography  08/01/2011    mild to mod TR, ca+ AOV leaflets, AOV mildly sclerotic  . Nm myocar perf wall motion  01/11/2010    normal     Current Outpatient Prescriptions  Medication Sig Dispense Refill  . aspirin 81 MG tablet Take 81 mg by mouth 2 (two) times daily.     . Biotin (BIOTIN 5000) 5 MG CAPS Take 1 capsule by mouth daily.    Marland Kitchen DEXILANT 60 MG capsule Take 60 mg by mouth daily.     Marland Kitchen docusate sodium 100 MG CAPS Take 100 mg by mouth 2 (two) times daily. 10 capsule 0  . furosemide (LASIX) 20 MG tablet Take 1 tablet by mouth as needed.    . gabapentin (NEURONTIN) 100 MG capsule Take 1 capsule by mouth daily.    Marland Kitchen levothyroxine (SYNTHROID, LEVOTHROID) 25 MCG tablet Take 1 tablet by mouth daily. Take 1 tab daily    . losartan-hydrochlorothiazide (HYZAAR) 100-12.5 MG per tablet Take 30 tablets by mouth daily. 30 tablet 6  . lovastatin (MEVACOR) 20 MG tablet Take 20 mg by mouth at bedtime.    . Magnesium 250 MG TABS Take 1 tablet by mouth daily.    . metoprolol (  LOPRESSOR) 50 MG tablet Take 1 tablet (50 mg total) by mouth daily. Take 1 tablet daily 30 tablet 6  . NITROSTAT 0.4 MG SL tablet as needed.    . potassium chloride (K-DUR) 10 MEQ tablet Take 1 tablet by mouth as needed.    Marland Kitchen Specialty Vitamins Products (MAGNESIUM, AMINO ACID CHELATE,) 133 MG tablet Take 1 tablet by mouth 2 (two) times daily.    . vitamin C (ASCORBIC ACID) 500 MG tablet Take 500 mg by mouth daily.     No current facility-administered medications for this visit.    Allergies:   Aspirin; Celecoxib; Levaquin; Lipitor; Sulfonamide derivatives; and Sulfa antibiotics    Social History:  The patient  reports that she has never smoked. She does not have any smokeless tobacco history on file. She reports that she does not drink alcohol or use illicit drugs.   Family History:  The patient's family  history includes Cancer in her brother, brother, and sister; Diabetes in her brother; Heart attack in her brother, brother, and sister; Stroke in her father.    ROS:  Please see the history of present illness.    Otherwise, review of systems positive for none.   All other systems are reviewed and negative.    PHYSICAL EXAM: VS:  BP 152/66 mmHg  Pulse 65  Resp 16  Ht 5' (1.524 m)  Wt 120 lb 6 oz (54.602 kg)  BMI 23.51 kg/m2 , BMI Body mass index is 23.51 kg/(m^2).  recheck BP 118/65 just 20-30 minutes before the first assessment General: Alert, oriented x3, no distress Head: no evidence of trauma, PERRL, EOMI, no exophtalmos or lid lag, no myxedema, no xanthelasma; normal ears, nose and oropharynx Neck: normal jugular venous pulsations and no hepatojugular reflux; brisk carotid pulses without delay and no carotid bruits Chest: clear to auscultation, no signs of consolidation by percussion or palpation, normal fremitus, symmetrical and full respiratory excursions Cardiovascular: normal position and quality of the apical impulse, regular rhythm, normal first and paradoxically split second heart sounds, no murmurs, rubs or gallops Abdomen: no tenderness or distention, no masses by palpation, no abnormal pulsatility or arterial bruits, normal bowel sounds, no hepatosplenomegaly Extremities: no clubbing, cyanosis or edema; 2+ radial, ulnar and brachial pulses bilaterally; 2+ right femoral, posterior tibial and dorsalis pedis pulses; 2+ left femoral, posterior tibial and dorsalis pedis pulses; no subclavian or femoral bruits Neurological: grossly nonfocal Psych: euthymic mood, full affect   EKG:  EKG is ordered today. The ekg ordered today demonstrates   Normal sinus rhythm right bundle branch block, no acute repolarization abnormalities   Recent Labs: No results found for requested labs within last 365 days.    Lipid Panel    Component Value Date/Time   CHOL 142 12/04/2008 0048    TRIG 96 12/04/2008 0048   HDL 54 12/04/2008 0048   CHOLHDL 2.6 Ratio 12/04/2008 0048   VLDL 19 12/04/2008 0048   LDLCALC 69 12/04/2008 0048      Wt Readings from Last 3 Encounters:  09/02/15 120 lb 6 oz (54.602 kg)  08/14/14 121 lb (54.885 kg)  08/05/14 121 lb 14.4 oz (55.293 kg)     ASSESSMENT AND PLAN:  CAD She underwent bypass surgery for left main coronary artery disease in 2007 and has done well since that time.  Normal recent [employer]. No angina pectoris in the last 12 months She should seek urgent medical attention if she develops chest discomfort that is not relieved after 3 sublingual nitroglycerin  tablets, lasting more than 25-30 minutes. Otherwise, the focus is on risk factor modification  HYPERLIPIDEMIA  excellent labs (that I last had available), retrieve more recent results from primary care provider  HYPERTENSION The pressure control is excellent  FATIGUE  consider reevaluation of thyroid function tests  Current medicines are reviewed at length with the patient today.  The patient does not have concerns regarding medicines.  The following changes have been made:  no change  Labs/ tests ordered today include:  Orders Placed This Encounter  Procedures  . EKG 12-Lead    Patient Instructions  Dr. Sallyanne Kuster recommends that you schedule a follow-up appointment in: ONE YEAR    SignedSanda Klein, MD  09/02/2015 5:46 PM    Sanda Klein, MD, [facility] [phone] office [phone] pager

## 2015-09-02 NOTE — Patient Instructions (Signed)
Dr. Croitoru recommends that you schedule a follow-up appointment in: ONE YEAR   

## 2016-10-06 ENCOUNTER — Encounter: Payer: Self-pay | Admitting: Cardiovascular Disease

## 2016-10-06 ENCOUNTER — Ambulatory Visit (INDEPENDENT_AMBULATORY_CARE_PROVIDER_SITE_OTHER): Payer: Medicare Other | Admitting: Cardiovascular Disease

## 2016-10-06 VITALS — BP 137/62 | HR 62 | Ht 63.0 in | Wt 126.0 lb

## 2016-10-06 DIAGNOSIS — E7800 Pure hypercholesterolemia, unspecified: Secondary | ICD-10-CM

## 2016-10-06 DIAGNOSIS — I10 Essential (primary) hypertension: Secondary | ICD-10-CM | POA: Diagnosis not present

## 2016-10-06 DIAGNOSIS — I25708 Atherosclerosis of coronary artery bypass graft(s), unspecified, with other forms of angina pectoris: Secondary | ICD-10-CM

## 2016-10-06 MED ORDER — METOPROLOL TARTRATE 25 MG PO TABS: 25.0000 mg | ORAL_TABLET | Freq: Two times a day (BID) | ORAL | 11 refills | Status: DC

## 2016-10-06 NOTE — Patient Instructions (Signed)
Dr Sallyanne Kuster has recommended making the following medication changes: 1. CHANGE the way you take Metoprolol - take 25 mg twice daily  Your physician recommends that you schedule a follow-up appointment in 1 year. You will receive a reminder letter in the mail two months in advance. If you don't receive a letter, please call our office to schedule the follow-up appointment.  If you need a refill on your cardiac medications before your next appointment, please call your pharmacy.

## 2016-10-06 NOTE — Progress Notes (Signed)
Cardiology Office Note    Date:  10/06/2016   ID:  Charleston Poot, DOB [birth year], MRN [id number]  PCP:  Wende Neighbors, MD  Cardiologist:   Sanda Klein, MD   Chief Complaint  Patient presents with  . Follow-up    pt c/o swelling and sob    History of Present Illness:  [name] is a 83 y.o. female with history of coronary artery disease, now roughly 10 years following bypass surgery (CABG 10/28/2005 LIMA to LAD,SVG to OM,SVG to distal RCA). She has not had new coronary events since then. She generally does well. She gets around with a cane when outside her home, but does not need support while inside. Denies shortness of breath or angina, but does complain of fatigue. She has been taking the Toprol tartrate 50 mg once daily. If she does not take furosemide she notices that her ankles and her fingers swell, but she does not really report shortness of breath. She takes a very low dose of levothyroxine. I do not yet have access to her most recent labs.    Past Medical History:  Diagnosis Date  . CAD (coronary artery disease)    CABG 10/28/2005 LIMA to LAD,SVG to OM,SVG to distal RCA  . Cancer ([facility])    vaginal  . Carotid atherosclerosis    minor  . Coronary artery disease   . Hyperlipidemia   . Hypertension   . Systemic hypertension     Past Surgical History:  Procedure Laterality Date  . ABDOMINAL HYSTERECTOMY    . ABDOMINAL HYSTERECTOMY  1966  . CARDIAC CATHETERIZATION  10/27/2005   left main and severe 2 vessel disease.  Marland Kitchen CATARACT EXTRACTION  1998 & 2004  . CESAREAN SECTION    . CHOLECYSTECTOMY    . CORONARY ARTERY BYPASS GRAFT    . CORONARY ARTERY BYPASS GRAFT  10/28/2005   LIMA to LAD,SVG to OM,SVG to distal RCA  . NM MYOCAR PERF WALL MOTION  01/11/2010   normal  . US ECHOCARDIOGRAPHY  08/01/2011   mild to mod TR, ca+ AOV leaflets, AOV mildly sclerotic    Current Medications: Outpatient Medications Prior to Visit  Medication Sig Dispense Refill  . Biotin (BIOTIN  5000) 5 MG CAPS Take 1 capsule by mouth daily.    Marland Kitchen DEXILANT 60 MG capsule Take 60 mg by mouth daily.     Marland Kitchen docusate sodium 100 MG CAPS Take 100 mg by mouth 2 (two) times daily. 10 capsule 0  . furosemide (LASIX) 20 MG tablet Take 1 tablet by mouth as needed.    . gabapentin (NEURONTIN) 100 MG capsule Take 1 capsule by mouth daily.    Marland Kitchen levothyroxine (SYNTHROID, LEVOTHROID) 25 MCG tablet Take 1 tablet by mouth daily. Take 1 tab daily    . lovastatin (MEVACOR) 20 MG tablet Take 20 mg by mouth at bedtime.    Marland Kitchen NITROSTAT 0.4 MG SL tablet as needed.    . potassium chloride (K-DUR) 10 MEQ tablet Take 1 tablet by mouth as needed.    . metoprolol (LOPRESSOR) 50 MG tablet Take 1 tablet (50 mg total) by mouth daily. Take 1 tablet daily 30 tablet 6  . Magnesium 250 MG TABS Take 1 tablet by mouth daily.    Marland Kitchen aspirin 81 MG tablet Take 81 mg by mouth 2 (two) times daily.     Marland Kitchen losartan-hydrochlorothiazide (HYZAAR) 100-12.5 MG per tablet Take 30 tablets by mouth daily. (Patient not taking: Reported on 10/06/2016) 30 tablet  6  . Specialty Vitamins Products (MAGNESIUM, AMINO ACID CHELATE,) 133 MG tablet Take 1 tablet by mouth 2 (two) times daily.    . vitamin C (ASCORBIC ACID) 500 MG tablet Take 500 mg by mouth daily.     No facility-administered medications prior to visit.      Allergies:   Lipitor [atorvastatin]; Aspirin; Celecoxib; Levaquin [levofloxacin in d5w]; Sulfa antibiotics; and Sulfonamide derivatives   Social History   Social History  . Marital status: Married    Spouse name: N/A  . Number of children: N/A  . Years of education: N/A   Social History Main Topics  . Smoking status: Never Smoker  . Smokeless tobacco: None  . Alcohol use No  . Drug use: No  . Sexual activity: Not Currently   Other Topics Concern  . None   Social History Narrative   ** Merged History Encounter **         Family History:  The patient's family history includes Cancer in her brother, brother, and  sister; Diabetes in her brother; Heart attack in her brother, brother, and sister; Stroke in her father.   ROS:   Please see the history of present illness.    ROS All other systems reviewed and are negative.   PHYSICAL EXAM:   VS:  BP 137/62   Pulse 62   Ht 5\' 3"  (1.6 m)   Wt 126 lb (57.2 kg)   BMI 22.32 kg/m    GEN: Well nourished, well developed, in no acute distress  HEENT: normal  Neck: no JVD, carotid bruits, or masses Cardiac: Widely split S2, RRR; no murmurs, rubs, or gallops,no edema  Respiratory:  clear to auscultation bilaterally, normal work of breathing GI: soft, nontender, nondistended, + BS MS: no deformity or atrophy  Skin: warm and dry, no rash Neuro:  Alert and Oriented x 3, Strength and sensation are intact Psych: euthymic mood, full affect  Wt Readings from Last 3 Encounters:  10/06/16 126 lb (57.2 kg)  09/02/15 120 lb 6 oz (54.6 kg)  08/14/14 121 lb (54.9 kg)      Studies/Labs Reviewed:   EKG:  EKG is ordered today.  The ekg ordered today demonstrates Normal sinus rhythm, old right bundle branch block  Recent Labs: No results found for requested labs within last 8760 hours.   Lipid Panel    Component Value Date/Time   CHOL 142 12/04/2008 0048   TRIG 96 12/04/2008 0048   HDL 54 12/04/2008 0048   CHOLHDL 2.6 Ratio 12/04/2008 0048   VLDL 19 12/04/2008 0048   LDLCALC 69 12/04/2008 0048    December 2015 total cholesterol 125, HDL 48, LDL 65, TG 75  ASSESSMENT:    1. Atherosclerosis of coronary artery bypass graft of native heart with other forms of angina pectoris ([place])      PLAN:  In order of problems listed above:  1. CAD: She does not have any symptoms of coronary insufficiency. Normal nuclear stress test in November 2015. Continue with focus on risk factor modification. 2. HTN: Her blood pressure is well controlled. She should take the metoprolol tartrate and 2 equal daily divided doses. Maybe her fatigue is due to the once daily  higher dose of beta blocker. He to review her hemoglobin and her TSH to make sure that her fatigue does not have other etiology. 3. HLP: The most recent lipid profile that I have access to was excellent and she continues to take the same statin as she did  back then. We'll try to get an updated lipid profile from Dr. Nevada Crane.    Medication Adjustments/Labs and Tests Ordered: Current medicines are reviewed at length with the patient today.  Concerns regarding medicines are outlined above.  Medication changes, Labs and Tests ordered today are listed in the Patient Instructions below. Patient Instructions  Dr Sallyanne Kuster has recommended making the following medication changes: 1. CHANGE the way you take Metoprolol - take 25 mg twice daily  Your physician recommends that you schedule a follow-up appointment in 1 year. You will receive a reminder letter in the mail two months in advance. If you don't receive a letter, please call our office to schedule the follow-up appointment.  If you need a refill on your cardiac medications before your next appointment, please call your pharmacy.    Signed, Sanda Klein, MD  10/06/2016 5:55 PM    [address], [place], [place]  [address] Phone: [phone]; Fax: [phone]

## 2017-01-25 ENCOUNTER — Other Ambulatory Visit: Payer: Self-pay | Admitting: Cardiovascular Disease

## 2017-01-25 MED ORDER — NITROGLYCERIN 0.4 MG SL SUBL: 0.4000 mg | SUBLINGUAL_TABLET | SUBLINGUAL | 3 refills | Status: DC | PRN

## 2017-01-25 NOTE — Telephone Encounter (Signed)
°*  STAT* If patient is at the pharmacy, call can be transferred to refill team.   1. Which medications need to be refilled? (please list name of each medication and dose if known) new prescription for Nitroglycerin  2. Which pharmacy/location (including street and city if local pharmacy) is medication to be sent to?[place] [phone]  3. Do they need a 30 day or 90 day supply?

## 2017-01-25 NOTE — Telephone Encounter (Signed)
Rx(s) sent to pharmacy electronically.  

## 2017-05-23 ENCOUNTER — Other Ambulatory Visit: Payer: Self-pay

## 2017-05-23 MED ORDER — METOPROLOL TARTRATE 25 MG PO TABS: 25.0000 mg | ORAL_TABLET | Freq: Two times a day (BID) | ORAL | 11 refills | Status: DC

## 2017-05-24 ENCOUNTER — Telehealth: Payer: Self-pay | Admitting: Cardiovascular Disease

## 2017-05-24 MED ORDER — METOPROLOL TARTRATE 25 MG PO TABS: 25.0000 mg | ORAL_TABLET | Freq: Two times a day (BID) | ORAL | 1 refills | Status: DC

## 2017-05-24 NOTE — Telephone Encounter (Signed)
Attempted to call Pt, to let let Pt know medication has been refilled at requested pharmacy, Pt did not answer left a vmail.

## 2017-05-24 NOTE — Telephone Encounter (Signed)
New message      *STAT* If patient is at the pharmacy, call can be transferred to refill team.   1. Which medications need to be refilled? (please list name of each medication and dose if known)   metoprolol tartrate (LOPRESSOR) 25 MG tablet Take 1 tablet (25 mg total) by mouth 2 (two) times daily.     2. Which pharmacy/location (including street and city if local pharmacy) is medication to be sent to?  [place]  3. Do they need a 30 day or 90 day supply?  Needs prescription changed from 30 to 90 day Medicare is requesting medication to be changed to 90 day

## 2017-05-25 ENCOUNTER — Telehealth: Payer: Self-pay | Admitting: Cardiovascular Disease

## 2017-05-25 NOTE — Telephone Encounter (Signed)
S/w pt informed #90 refill sent

## 2017-05-25 NOTE — Telephone Encounter (Signed)
New message    Pt is returning call from yesterday.

## 2017-11-22 ENCOUNTER — Ambulatory Visit: Payer: Medicare Other | Admitting: Cardiovascular Disease

## 2017-11-22 ENCOUNTER — Encounter: Payer: Self-pay | Admitting: Cardiovascular Disease

## 2017-11-22 VITALS — BP 124/62 | HR 56 | Ht 63.0 in | Wt 113.8 lb

## 2017-11-22 DIAGNOSIS — I10 Essential (primary) hypertension: Secondary | ICD-10-CM | POA: Diagnosis not present

## 2017-11-22 DIAGNOSIS — E7800 Pure hypercholesterolemia, unspecified: Secondary | ICD-10-CM | POA: Diagnosis not present

## 2017-11-22 DIAGNOSIS — I25110 Atherosclerotic heart disease of native coronary artery with unstable angina pectoris: Secondary | ICD-10-CM

## 2017-11-22 NOTE — Patient Instructions (Signed)
Dr Croitoru recommends that you schedule a follow-up appointment in 12 months. You will receive a reminder letter in the mail two months in advance. If you don't receive a letter, please call our office to schedule the follow-up appointment.  If you need a refill on your cardiac medications before your next appointment, please call your pharmacy. 

## 2017-11-22 NOTE — Progress Notes (Signed)
Cardiology Office Note    Date:  11/25/2017   ID:  [name], [name] [birth year], MRN [id number]  PCP:  Celene Squibb, MD  Cardiologist:   Sanda Klein, MD   Chief Complaint  Patient presents with  . Follow-up  Coronary artery disease  History of Present Illness:  [name] is a 84 y.o. female with history of coronary artery disease, now roughly 11 years following bypass surgery (CABG 10/28/2005 LIMA to LAD,SVG to OM,SVG to distal RCA). She has not had new coronary events since then.   Currently doing well, but about a month ago she had consecutive days where she had to take sublingual nitroglycerin for prolonged episodes of chest discomfort at rest.  The symptoms have subsided and she does not have any angina with household activities.  Seems to be back at baseline.  Does not often leave the house.  She was not aware of anything that might have triggered the problems with angina.  She did not really ask for medical advice.  Exertional dyspnea, leg edema (as long as she takes her furosemide), intermittent claudication, focal neurological complaints, palpitations, tachycardia, focal neurological complaints, bleeding problems.   Past Medical History:  Diagnosis Date  . CAD (coronary artery disease)    CABG 10/28/2005 LIMA to LAD,SVG to OM,SVG to distal RCA  . Cancer (HCC)    vaginal  . Carotid atherosclerosis    minor  . Coronary artery disease   . Hyperlipidemia   . Hypertension   . Systemic hypertension     Past Surgical History:  Procedure Laterality Date  . ABDOMINAL HYSTERECTOMY    . ABDOMINAL HYSTERECTOMY  1966  . CARDIAC CATHETERIZATION  10/27/2005   left main and severe 2 vessel disease.  Marland Kitchen CATARACT EXTRACTION  1998 & 2004  . CESAREAN SECTION    . CHOLECYSTECTOMY    . CORONARY ARTERY BYPASS GRAFT    . CORONARY ARTERY BYPASS GRAFT  10/28/2005   LIMA to LAD,SVG to OM,SVG to distal RCA  . NM MYOCAR PERF WALL MOTION  01/11/2010   normal  . US ECHOCARDIOGRAPHY   08/01/2011   mild to mod TR, ca+ AOV leaflets, AOV mildly sclerotic    Current Medications: Outpatient Medications Prior to Visit  Medication Sig Dispense Refill  . acetaminophen (TYLENOL) 500 MG tablet Take 500 mg by mouth daily.    . Biotin (BIOTIN 5000) 5 MG CAPS Take 1 capsule by mouth daily.    Marland Kitchen DEXILANT 60 MG capsule Take 60 mg by mouth daily.     Marland Kitchen docusate sodium 100 MG CAPS Take 100 mg by mouth 2 (two) times daily. 10 capsule 0  . furosemide (LASIX) 20 MG tablet Take 1 tablet by mouth as needed.    . gabapentin (NEURONTIN) 100 MG capsule Take 1 capsule by mouth daily.    Marland Kitchen levothyroxine (SYNTHROID, LEVOTHROID) 50 MCG tablet     . losartan (COZAAR) 100 MG tablet Take 100 mg by mouth daily.    Marland Kitchen lovastatin (MEVACOR) 20 MG tablet Take 20 mg by mouth at bedtime.    . Magnesium 250 MG TABS Take 1 tablet by mouth daily.    . metoprolol tartrate (LOPRESSOR) 25 MG tablet Take 1 tablet (25 mg total) by mouth 2 (two) times daily. 180 tablet 1  . nitroGLYCERIN (NITROSTAT) 0.4 MG SL tablet Place 1 tablet (0.4 mg total) under the tongue every 5 (five) minutes as needed. 25 tablet 3  . NON FORMULARY Take 1 tablet  by mouth daily. MAGNA-CAL TABLETS    . NON FORMULARY Take 1 tablet by mouth daily. Leg cramps tablet    . potassium chloride (K-DUR) 10 MEQ tablet Take 1 tablet by mouth as needed.    Marland Kitchen levothyroxine (SYNTHROID, LEVOTHROID) 25 MCG tablet Take 1 tablet by mouth daily. Take 1 tab daily     No facility-administered medications prior to visit.      Allergies:   Lipitor [atorvastatin]; Aspirin; Celecoxib; Levaquin [levofloxacin in d5w]; Sulfa antibiotics; and Sulfonamide derivatives   Social History   Socioeconomic History  . Marital status: Married    Spouse name: None  . Number of children: None  . Years of education: None  . Highest education level: None  Social Needs  . Financial resource strain: None  . Food insecurity - worry: None  . Food insecurity - inability: None    . Transportation needs - medical: None  . Transportation needs - non-medical: None  Occupational History  . None  Tobacco Use  . Smoking status: Never Smoker  . Smokeless tobacco: Never Used  Substance and Sexual Activity  . Alcohol use: No  . Drug use: No  . Sexual activity: Not Currently  Other Topics Concern  . None  Social History Narrative   ** Merged History Encounter **         Family History:  The patient's family history includes Cancer in her brother, brother, and sister; Diabetes in her brother; Heart attack in her brother, brother, and sister; Stroke in her father.   ROS:   Please see the history of present illness.    ROS All other systems reviewed and are negative.   PHYSICAL EXAM:   VS:  BP 124/62 (BP Location: Right Arm, Patient Position: Sitting, Cuff Size: Normal)   Pulse (!) 56   Ht 5\' 3"  (1.6 m)   Wt 113 lb 12.8 oz (51.6 kg)   BMI 20.16 kg/m     General: Alert, oriented x3, no distress, lean Head: no evidence of trauma, PERRL, EOMI, no exophtalmos or lid lag, no myxedema, no xanthelasma; normal ears, nose and oropharynx Neck: normal jugular venous pulsations and no hepatojugular reflux; brisk carotid pulses without delay and no carotid bruits Chest: clear to auscultation, no signs of consolidation by percussion or palpation, normal fremitus, symmetrical and full respiratory excursions Cardiovascular: normal position and quality of the apical impulse, regular rhythm, normal first and widely split second heart sounds, no murmurs, rubs or gallops Abdomen: no tenderness or distention, no masses by palpation, no abnormal pulsatility or arterial bruits, normal bowel sounds, no hepatosplenomegaly Extremities: no clubbing, cyanosis or edema; 2+ radial, ulnar and brachial pulses bilaterally; 2+ right femoral, posterior tibial and dorsalis pedis pulses; 2+ left femoral, posterior tibial and dorsalis pedis pulses; no subclavian or femoral bruits Neurological:  grossly nonfocal Psych: Normal mood and affect   Wt Readings from Last 3 Encounters:  11/22/17 113 lb 12.8 oz (51.6 kg)  10/06/16 126 lb (57.2 kg)  09/02/15 120 lb 6 oz (54.6 kg)      Studies/Labs Reviewed:   EKG:  EKG is ordered today.  The ekg ordered today demonstrates sinus bradycardia, RBBB (old), ischemic abnormalities  Recent Labs: Performed in Dr. Juel Burrow office November 14, 2017 Hemoglobin 11.6 (normocytic), creatinine 1.05, BUN 23, potassium 4.9, TSH increased at 8.34, normal LFTs  Lipid Panel Total cholesterol 145, triglycerides 58, HDL 60, LDL 73   ASSESSMENT:    1. Coronary artery disease involving native coronary artery of  native heart with unstable angina pectoris ([place])   2. Essential hypertension   3. Hypercholesteremia      PLAN:  In order of problems listed above:  1. CAD: Like she might have had some unstable angina month ago, but symptoms have subsided. Normal nuclear stress test in November 2015.  She is 84 years old and would prefer noninvasive management.  Continue with focus on risk factor modification.  We discussed the difference stable and unstable angina pectoris and when she should seek emergency medical attention (has pain at rest not relieved by 3 consecutive sublingual nitroglycerin or lasting more than 20-30 minutes). 2. HTN: Well-controlled 3. HLP: Excellent lipid profile   Medication Adjustments/Labs and Tests Ordered: Current medicines are reviewed at length with the patient today.  Concerns regarding medicines are outlined above.  Medication changes, Labs and Tests ordered today are listed in the Patient Instructions below. Patient Instructions  Dr Sallyanne Kuster recommends that you schedule a follow-up appointment in 12 months. You will receive a reminder letter in the mail two months in advance. If you don't receive a letter, please call our office to schedule the follow-up appointment.  If you need a refill on your cardiac medications  before your next appointment, please call your pharmacy.    Signed, Sanda Klein, MD  11/25/2017 3:33 PM    [address], [place], [place]  [address] Phone: [phone]; Fax: [phone]

## 2017-12-10 ENCOUNTER — Other Ambulatory Visit: Payer: Self-pay | Admitting: Cardiovascular Disease

## 2018-05-13 ENCOUNTER — Ambulatory Visit: Payer: Medicare Other | Admitting: Cardiology

## 2018-05-13 ENCOUNTER — Encounter: Payer: Self-pay | Admitting: Cardiology

## 2018-05-13 VITALS — BP 130/72 | HR 64 | Ht 60.0 in | Wt 114.0 lb

## 2018-05-13 DIAGNOSIS — R002 Palpitations: Secondary | ICD-10-CM

## 2018-05-13 DIAGNOSIS — Z951 Presence of aortocoronary bypass graft: Secondary | ICD-10-CM

## 2018-05-13 DIAGNOSIS — I4510 Unspecified right bundle-branch block: Secondary | ICD-10-CM

## 2018-05-13 MED ORDER — METOPROLOL TARTRATE 25 MG PO TABS: 25.0000 mg | ORAL_TABLET | Freq: Two times a day (BID) | ORAL | 3 refills | Status: DC

## 2018-05-13 NOTE — Progress Notes (Signed)
05/13/2018 [name]   June 23, 1925  [id number]  Primary Physician Celene Squibb, MD Primary Cardiologist: Dr Sallyanne Kuster  HPI:  Delightful 84 y/o female followed by Dr  Sallyanne Kuster with a history of CAD, s/p CABG x 3 in 2007, low risk Myoview in 2015 and normal LVF by echo in 2012. She presents to the office today accompanied by her daughter with complaints of palpitations she noted two weeks ago. She says she woke up with her heart beat "skipping". Its hard for her to describe but it does not sound like she had sustained tachycardia. This happened for 2 or 3 nights then went away. She denies chest pain. Her EKG today shows NSR.    Current Outpatient Medications  Medication Sig Dispense Refill  . acetaminophen (TYLENOL) 500 MG tablet Take 500 mg by mouth daily.    . Biotin (BIOTIN 5000) 5 MG CAPS Take 1 capsule by mouth daily.    Marland Kitchen DEXILANT 60 MG capsule Take 60 mg by mouth daily.     Marland Kitchen docusate sodium 100 MG CAPS Take 100 mg by mouth 2 (two) times daily. 10 capsule 0  . furosemide (LASIX) 20 MG tablet Take 1 tablet by mouth as needed.    . gabapentin (NEURONTIN) 100 MG capsule Take 1 capsule by mouth daily.    Marland Kitchen levothyroxine (SYNTHROID, LEVOTHROID) 50 MCG tablet     . losartan (COZAAR) 100 MG tablet Take 100 mg by mouth daily.    Marland Kitchen lovastatin (MEVACOR) 20 MG tablet Take 20 mg by mouth at bedtime.    . Magnesium 250 MG TABS Take 1 tablet by mouth daily.    . metoprolol tartrate (LOPRESSOR) 25 MG tablet Take 1 tablet (25 mg total) by mouth 2 (two) times daily. Take evening dose before bed 180 tablet 3  . nitroGLYCERIN (NITROSTAT) 0.4 MG SL tablet Place 1 tablet (0.4 mg total) under the tongue every 5 (five) minutes as needed. 25 tablet 3  . NON FORMULARY Take 1 tablet by mouth daily. MAGNA-CAL TABLETS    . NON FORMULARY Take 1 tablet by mouth daily. Leg cramps tablet    . potassium chloride (K-DUR) 10 MEQ tablet Take 1 tablet by mouth as needed.     No current facility-administered  medications for this visit.     Allergies  Allergen Reactions  . Lipitor [Atorvastatin]   . Aspirin Swelling    REACTION: High dose gives lip swelling  . Celecoxib Other (See Comments)    REACTION: GI upset  . Levaquin [Levofloxacin In D5w] Other (See Comments)    nausea  . Sulfa Antibiotics Rash  . Sulfonamide Derivatives Rash    REACTION: Rash    Past Medical History:  Diagnosis Date  . CAD (coronary artery disease)    CABG 10/28/2005 LIMA to LAD,SVG to OM,SVG to distal RCA  . Cancer (HCC)    vaginal  . Carotid atherosclerosis    minor  . Coronary artery disease   . Hyperlipidemia   . Hypertension   . Systemic hypertension     Social History   Socioeconomic History  . Marital status: Married    Spouse name: Not on file  . Number of children: Not on file  . Years of education: Not on file  . Highest education level: Not on file  Occupational History  . Not on file  Social Needs  . Financial resource strain: Not on file  . Food insecurity:    Worry: Not on file  Inability: Not on file  . Transportation needs:    Medical: Not on file    Non-medical: Not on file  Tobacco Use  . Smoking status: Never Smoker  . Smokeless tobacco: Never Used  Substance and Sexual Activity  . Alcohol use: No  . Drug use: No  . Sexual activity: Not Currently  Lifestyle  . Physical activity:    Days per week: Not on file    Minutes per session: Not on file  . Stress: Not on file  Relationships  . Social connections:    Talks on phone: Not on file    Gets together: Not on file    Attends religious service: Not on file    Active member of club or organization: Not on file    Attends meetings of clubs or organizations: Not on file    Relationship status: Not on file  . Intimate partner violence:    Fear of current or ex partner: Not on file    Emotionally abused: Not on file    Physically abused: Not on file    Forced sexual activity: Not on file  Other Topics Concern    . Not on file  Social History Narrative   ** Merged History Encounter **         Family History  Problem Relation Age of Onset  . Stroke Father   . Heart attack Sister   . Heart attack Brother   . Heart attack Brother   . Cancer Brother   . Cancer Brother   . Diabetes Brother   . Cancer Sister      Review of Systems: General: negative for chills, fever, night sweats or weight changes.  Cardiovascular: negative for chest pain, dyspnea on exertion, edema, orthopnea, paroxysmal nocturnal dyspnea or shortness of breath Dermatological: negative for rash Respiratory: negative for cough or wheezing Urologic: negative for hematuria Abdominal: negative for nausea, vomiting, diarrhea, bright red blood per rectum, melena, or hematemesis Neurologic: negative for visual changes, syncope, or dizziness All other systems reviewed and are otherwise negative except as noted above.    Blood pressure 130/72, pulse 64, height 5' (1.524 m), weight 114 lb (51.7 kg).  General appearance: alert, cooperative and no distress Neck: no carotid bruit and no JVD Lungs: clear to auscultation bilaterally Heart: regular rate and rhythm Extremities: extremities normal, atraumatic, no cyanosis or edema Skin: Skin color, texture, turgor normal. No rashes or lesions Neurologic: Grossly normal  EKG NSR, 64, RBBB  ASSESSMENT AND PLAN:   Palpitations Her history sounds like ectopy as opposed to an arrythmia  Hx of CABG CABG 2007 LIMA to LAD, SVG to OM, SVG to distal RCA, normal left ventricular systolic function Myoview low risk 2015  RBBB Chronic   PLAN  I suggested she take her PM Metoprolol Q HS instead of at supper. At this time I would not pursue on monitor but consider that if she develops more symptoms.   Kerin Ransom PA-C 05/13/2018 1:41 PM

## 2018-05-13 NOTE — Assessment & Plan Note (Signed)
Her history sounds like ectopy as opposed to an arrythmia

## 2018-05-13 NOTE — Assessment & Plan Note (Signed)
Chronic. 

## 2018-05-13 NOTE — Assessment & Plan Note (Signed)
CABG 2007 LIMA to LAD, SVG to OM, SVG to distal RCA, normal left ventricular systolic function Myoview low risk 2015

## 2018-05-13 NOTE — Patient Instructions (Signed)
Medication Instructions:  TAKE Metoprolol in the morning and in the evening before bed  Labwork: None   Testing/Procedures: None   Follow-Up: Your physician wants you to follow-up in: 6 months with Dr Sallyanne Kuster. You will receive a reminder letter in the mail two months in advance. If you don't receive a letter, please call our office to schedule the follow-up appointment.  Any Other Special Instructions Will Be Listed Below (If Applicable). If you need a refill on your cardiac medications before your next appointment, please call your pharmacy.

## 2018-05-13 NOTE — Progress Notes (Signed)
Thanks MCr 

## 2018-11-08 ENCOUNTER — Encounter: Payer: Self-pay | Admitting: Cardiovascular Disease

## 2018-11-08 ENCOUNTER — Ambulatory Visit: Payer: Medicare Other | Admitting: Cardiovascular Disease

## 2018-11-08 VITALS — BP 124/66 | HR 59 | Ht 60.0 in | Wt 109.6 lb

## 2018-11-08 DIAGNOSIS — I10 Essential (primary) hypertension: Secondary | ICD-10-CM

## 2018-11-08 DIAGNOSIS — E7800 Pure hypercholesterolemia, unspecified: Secondary | ICD-10-CM

## 2018-11-08 DIAGNOSIS — I25118 Atherosclerotic heart disease of native coronary artery with other forms of angina pectoris: Secondary | ICD-10-CM

## 2018-11-08 NOTE — Patient Instructions (Signed)
Medication Instructions:  Your physician recommends that you continue on your current medications as directed. Please refer to the Current Medication list given to you today.  If you need a refill on your cardiac medications before your next appointment, please call your pharmacy.   Lab work: None  If you have labs (blood work) drawn today and your tests are completely normal, you will receive your results only by: Marland Kitchen MyChart Message (if you have MyChart) OR . A paper copy in the mail If you have any lab test that is abnormal or we need to change your treatment, we will call you to review the results.  Testing/Procedures: None  Follow-Up: At [facility], you and your health needs are our priority.  As part of our continuing mission to provide you with exceptional heart care, we have created designated Provider Care Teams.  These Care Teams include your primary Cardiologist (physician) and Advanced Practice Providers (APPs -  Physician Assistants and Nurse Practitioners) who all work together to provide you with the care you need, when you need it. You will need a follow up appointment in 1 years.  Please call our office 2 months in advance to schedule this appointment.  You may see Sanda Klein, MD or one of the following Advanced Practice Providers on your designated Care Team: [place], [place] . Fabian Sharp, PA-C  Any Other Special Instructions Will Be Listed Below (If Applicable). None

## 2018-11-08 NOTE — Progress Notes (Signed)
Cardiology Office Note    Date:  11/10/2018   ID:  Kerrilynn, Derenzo [birth year], MRN [id number]  PCP:  Celene Squibb, MD  Cardiologist:   Sanda Klein, MD   Chief Complaint  Patient presents with  . Coronary Artery Disease  Coronary artery disease  History of Present Illness:  [name] is a 85 y.o. female with history of coronary artery disease, now roughly 13 years following bypass surgery (CABG 10/28/2005 LIMA to LAD,SVG to OM,SVG to distal RCA). She has not had new coronary events since then.   The patient specifically denies any chest pain at rest exertion, dyspnea at rest or with exertion, orthopnea, paroxysmal nocturnal dyspnea, syncope, palpitations, focal neurological deficits, intermittent claudication, lower extremity edema, unexplained weight gain, cough, hemoptysis or wheezing.  Not listed on her medications list, but she confirms that she is taking aspirin 81 mg daily.  Excellent recent lipid profile with a total cholesterol 122, HDL 54, triglycerides 70.  Her last hemoglobin A1c was borderline at 5.7% without any medication for glucose control.  She is borderline underweight.  The patient specifically denies any chest pain at rest exertion, dyspnea at rest or with exertion, orthopnea, paroxysmal nocturnal dyspnea, syncope, palpitations, focal neurological deficits, intermittent claudication, lower extremity edema, unexplained weight gain, cough, hemoptysis or wheezing.  She has lost a few more pounds since her last appointment and is now borderline underweight   Past Medical History:  Diagnosis Date  . CAD (coronary artery disease)    CABG 10/28/2005 LIMA to LAD,SVG to OM,SVG to distal RCA  . Cancer (HCC)    vaginal  . Carotid atherosclerosis    minor  . Coronary artery disease   . Hyperlipidemia   . Hypertension   . Systemic hypertension     Past Surgical History:  Procedure Laterality Date  . ABDOMINAL HYSTERECTOMY    . ABDOMINAL HYSTERECTOMY  1966  .  CARDIAC CATHETERIZATION  10/27/2005   left main and severe 2 vessel disease.  Marland Kitchen CATARACT EXTRACTION  1998 & 2004  . CESAREAN SECTION    . CHOLECYSTECTOMY    . CORONARY ARTERY BYPASS GRAFT    . CORONARY ARTERY BYPASS GRAFT  10/28/2005   LIMA to LAD,SVG to OM,SVG to distal RCA  . NM MYOCAR PERF WALL MOTION  01/11/2010   normal  . US ECHOCARDIOGRAPHY  08/01/2011   mild to mod TR, ca+ AOV leaflets, AOV mildly sclerotic    Current Medications: Outpatient Medications Prior to Visit  Medication Sig Dispense Refill  . acetaminophen (TYLENOL) 500 MG tablet Take 500 mg by mouth daily.    . Biotin (BIOTIN 5000) 5 MG CAPS Take 1 capsule by mouth daily.    Marland Kitchen DEXILANT 60 MG capsule Take 60 mg by mouth daily.     Marland Kitchen docusate sodium 100 MG CAPS Take 100 mg by mouth 2 (two) times daily. 10 capsule 0  . furosemide (LASIX) 20 MG tablet Take 1 tablet by mouth as needed.    . gabapentin (NEURONTIN) 100 MG capsule Take 1 capsule by mouth daily.    Marland Kitchen levothyroxine (SYNTHROID, LEVOTHROID) 50 MCG tablet     . losartan (COZAAR) 100 MG tablet Take 100 mg by mouth daily.    Marland Kitchen lovastatin (MEVACOR) 20 MG tablet Take 20 mg by mouth at bedtime.    . Magnesium 250 MG TABS Take 1 tablet by mouth daily.    . metoprolol tartrate (LOPRESSOR) 25 MG tablet Take 1 tablet (25 mg total)  by mouth 2 (two) times daily. Take evening dose before bed 180 tablet 3  . nitroGLYCERIN (NITROSTAT) 0.4 MG SL tablet Place 1 tablet (0.4 mg total) under the tongue every 5 (five) minutes as needed. 25 tablet 3  . NON FORMULARY Take 1 tablet by mouth daily. MAGNA-CAL TABLETS    . NON FORMULARY Take 1 tablet by mouth daily. Leg cramps tablet    . potassium chloride (K-DUR) 10 MEQ tablet Take 1 tablet by mouth as needed.     No facility-administered medications prior to visit.      Allergies:   Lipitor [atorvastatin]; Aspirin; Celecoxib; Levaquin [levofloxacin in d5w]; Sulfa antibiotics; and Sulfonamide derivatives   Social History    Socioeconomic History  . Marital status: Married    Spouse name: Not on file  . Number of children: Not on file  . Years of education: Not on file  . Highest education level: Not on file  Occupational History  . Not on file  Social Needs  . Financial resource strain: Not on file  . Food insecurity:    Worry: Not on file    Inability: Not on file  . Transportation needs:    Medical: Not on file    Non-medical: Not on file  Tobacco Use  . Smoking status: Never Smoker  . Smokeless tobacco: Never Used  Substance and Sexual Activity  . Alcohol use: No  . Drug use: No  . Sexual activity: Not Currently  Lifestyle  . Physical activity:    Days per week: Not on file    Minutes per session: Not on file  . Stress: Not on file  Relationships  . Social connections:    Talks on phone: Not on file    Gets together: Not on file    Attends religious service: Not on file    Active member of club or organization: Not on file    Attends meetings of clubs or organizations: Not on file    Relationship status: Not on file  Other Topics Concern  . Not on file  Social History Narrative   ** Merged History Encounter **         Family History:  The patient's family history includes Cancer in her brother, brother, and sister; Diabetes in her brother; Heart attack in her brother, brother, and sister; Stroke in her father.   ROS:   Please see the history of present illness.    ROS All other systems reviewed and are negative.   PHYSICAL EXAM:   VS:  BP 124/66   Pulse (!) 59   Ht 5' (1.524 m)   Wt 109 lb 9.6 oz (49.7 kg)   BMI 21.40 kg/m      General: Alert, oriented x3, no distress, very lean Head: no evidence of trauma, PERRL, EOMI, no exophtalmos or lid lag, no myxedema, no xanthelasma; normal ears, nose and oropharynx Neck: normal jugular venous pulsations and no hepatojugular reflux; brisk carotid pulses without delay and no carotid bruits Chest: clear to auscultation, no signs  of consolidation by percussion or palpation, normal fremitus, symmetrical and full respiratory excursions Cardiovascular: normal position and quality of the apical impulse, regular rhythm, normal first and second heart sounds, no murmurs, rubs or gallops Abdomen: no tenderness or distention, no masses by palpation, no abnormal pulsatility or arterial bruits, normal bowel sounds, no hepatosplenomegaly Extremities: no clubbing, cyanosis or edema; 2+ radial, ulnar and brachial pulses bilaterally; 2+ right femoral, posterior tibial and dorsalis pedis pulses; 2+  left femoral, posterior tibial and dorsalis pedis pulses; no subclavian or femoral bruits Neurological: grossly nonfocal Psych: Normal mood and affect   Wt Readings from Last 3 Encounters:  11/08/18 109 lb 9.6 oz (49.7 kg)  05/13/18 114 lb (51.7 kg)  11/22/17 113 lb 12.8 oz (51.6 kg)      Studies/Labs Reviewed:   EKG:  EKG is ordered today.  Sinus rhythm, right bundle branch block (old), ST segment depression and mild T wave inversion in leads V2-V4, 2, 3, aVF, not changed from previous tracings.  The QTC is 465 ms  Recent Labs: Performed in Dr. Juel Burrow office November 14, 2017 Hemoglobin 11.6 (normocytic), creatinine 1.05, BUN 23, potassium 4.9, TSH increased at 8.34, normal LFTs  Lipid Panel Total cholesterol 122, triglycerides 70, HDL 54, LDL 54   ASSESSMENT:    1. Coronary artery disease of native artery of native heart with stable angina pectoris ([place])   2. Essential hypertension   3. Hypercholesterolemia      PLAN:  In order of problems listed above:  1. CAD: Asymptomatic in the last several months. Normal nuclear stress test in November 2015.  She is 85 years old and would prefer noninvasive management.  Again reviewed the difference stable and unstable angina pectoris and when she should seek emergency medical attention (has pain at rest not relieved by 3 consecutive sublingual nitroglycerin or lasting more than 20-30  minutes). 2. HTN: Excellent control 3. HLP: Very good lipid profile   Medication Adjustments/Labs and Tests Ordered: Current medicines are reviewed at length with the patient today.  Concerns regarding medicines are outlined above.  Medication changes, Labs and Tests ordered today are listed in the Patient Instructions below. Patient Instructions  Medication Instructions:  Your physician recommends that you continue on your current medications as directed. Please refer to the Current Medication list given to you today.  If you need a refill on your cardiac medications before your next appointment, please call your pharmacy.   Lab work: None  If you have labs (blood work) drawn today and your tests are completely normal, you will receive your results only by: Marland Kitchen MyChart Message (if you have MyChart) OR . A paper copy in the mail If you have any lab test that is abnormal or we need to change your treatment, we will call you to review the results.  Testing/Procedures: None  Follow-Up: At [facility], you and your health needs are our priority.  As part of our continuing mission to provide you with exceptional heart care, we have created designated Provider Care Teams.  These Care Teams include your primary Cardiologist (physician) and Advanced Practice Providers (APPs -  Physician Assistants and Nurse Practitioners) who all work together to provide you with the care you need, when you need it. You will need a follow up appointment in 1 years.  Please call our office 2 months in advance to schedule this appointment.  You may see Sanda Klein, MD or one of the following Advanced Practice Providers on your designated Care Team: [place], [place] . Fabian Sharp, PA-C  Any Other Special Instructions Will Be Listed Below (If Applicable). None      Signed, Sanda Klein, MD  11/10/2018 11:57 AM    [facility] [place], [place],   [address] Phone: [phone]; Fax: [phone]

## 2018-11-10 ENCOUNTER — Encounter: Payer: Self-pay | Admitting: Cardiovascular Disease

## 2018-12-09 ENCOUNTER — Other Ambulatory Visit: Payer: Self-pay | Admitting: Cardiovascular Disease

## 2018-12-22 ENCOUNTER — Observation Stay (HOSPITAL_COMMUNITY)
Admission: EM | Admit: 2018-12-22 | Discharge: 2018-12-23 | Disposition: A | Discharge status: Home / Self Care | Payer: Medicare Other | Attending: Internal Medicine | Admitting: Internal Medicine

## 2018-12-22 ENCOUNTER — Other Ambulatory Visit: Payer: Self-pay

## 2018-12-22 DIAGNOSIS — R0789 Other chest pain: Secondary | ICD-10-CM | POA: Diagnosis present

## 2018-12-22 DIAGNOSIS — K219 Gastro-esophageal reflux disease without esophagitis: Secondary | ICD-10-CM | POA: Diagnosis not present

## 2018-12-22 DIAGNOSIS — D649 Anemia, unspecified: Secondary | ICD-10-CM | POA: Diagnosis not present

## 2018-12-22 DIAGNOSIS — M8580 Other specified disorders of bone density and structure, unspecified site: Secondary | ICD-10-CM | POA: Insufficient documentation

## 2018-12-22 DIAGNOSIS — I700 Atherosclerosis of aorta: Secondary | ICD-10-CM | POA: Insufficient documentation

## 2018-12-22 DIAGNOSIS — Z8249 Family history of ischemic heart disease and other diseases of the circulatory system: Secondary | ICD-10-CM | POA: Insufficient documentation

## 2018-12-22 DIAGNOSIS — I129 Hypertensive chronic kidney disease with stage 1 through stage 4 chronic kidney disease, or unspecified chronic kidney disease: Secondary | ICD-10-CM | POA: Diagnosis not present

## 2018-12-22 DIAGNOSIS — E039 Hypothyroidism, unspecified: Secondary | ICD-10-CM | POA: Insufficient documentation

## 2018-12-22 DIAGNOSIS — Z7982 Long term (current) use of aspirin: Secondary | ICD-10-CM | POA: Diagnosis not present

## 2018-12-22 DIAGNOSIS — G43909 Migraine, unspecified, not intractable, without status migrainosus: Secondary | ICD-10-CM | POA: Insufficient documentation

## 2018-12-22 DIAGNOSIS — Z8679 Personal history of other diseases of the circulatory system: Secondary | ICD-10-CM

## 2018-12-22 DIAGNOSIS — Z79899 Other long term (current) drug therapy: Secondary | ICD-10-CM | POA: Insufficient documentation

## 2018-12-22 DIAGNOSIS — Z79890 Hormone replacement therapy (postmenopausal): Secondary | ICD-10-CM | POA: Insufficient documentation

## 2018-12-22 DIAGNOSIS — I4510 Unspecified right bundle-branch block: Secondary | ICD-10-CM | POA: Insufficient documentation

## 2018-12-22 DIAGNOSIS — M1990 Unspecified osteoarthritis, unspecified site: Secondary | ICD-10-CM | POA: Diagnosis not present

## 2018-12-22 DIAGNOSIS — M4802 Spinal stenosis, cervical region: Secondary | ICD-10-CM | POA: Insufficient documentation

## 2018-12-22 DIAGNOSIS — N182 Chronic kidney disease, stage 2 (mild): Secondary | ICD-10-CM | POA: Insufficient documentation

## 2018-12-22 DIAGNOSIS — Z951 Presence of aortocoronary bypass graft: Secondary | ICD-10-CM | POA: Diagnosis not present

## 2018-12-22 DIAGNOSIS — E785 Hyperlipidemia, unspecified: Secondary | ICD-10-CM | POA: Insufficient documentation

## 2018-12-22 DIAGNOSIS — I2510 Atherosclerotic heart disease of native coronary artery without angina pectoris: Secondary | ICD-10-CM | POA: Insufficient documentation

## 2018-12-22 DIAGNOSIS — I10 Essential (primary) hypertension: Secondary | ICD-10-CM | POA: Diagnosis present

## 2018-12-22 DIAGNOSIS — R079 Chest pain, unspecified: Secondary | ICD-10-CM

## 2018-12-22 NOTE — ED Triage Notes (Signed)
Per pt she has been having chest pain tonight all across and then went into her back. This started about 10 pm tonight. No nausea, no vomiting, Some SOB, Did take 3 nitro at home with relief. Pt is not having chest pain at this time

## 2018-12-23 ENCOUNTER — Emergency Department (HOSPITAL_COMMUNITY): Payer: Medicare Other

## 2018-12-23 ENCOUNTER — Other Ambulatory Visit (HOSPITAL_COMMUNITY): Payer: Self-pay

## 2018-12-23 DIAGNOSIS — R079 Chest pain, unspecified: Secondary | ICD-10-CM | POA: Diagnosis not present

## 2018-12-23 DIAGNOSIS — Z951 Presence of aortocoronary bypass graft: Secondary | ICD-10-CM

## 2018-12-23 DIAGNOSIS — I10 Essential (primary) hypertension: Secondary | ICD-10-CM

## 2018-12-23 DIAGNOSIS — R0789 Other chest pain: Secondary | ICD-10-CM

## 2018-12-23 DIAGNOSIS — I209 Angina pectoris, unspecified: Secondary | ICD-10-CM

## 2018-12-23 DIAGNOSIS — E039 Hypothyroidism, unspecified: Secondary | ICD-10-CM

## 2018-12-23 DIAGNOSIS — N182 Chronic kidney disease, stage 2 (mild): Secondary | ICD-10-CM | POA: Diagnosis not present

## 2018-12-23 DIAGNOSIS — K219 Gastro-esophageal reflux disease without esophagitis: Secondary | ICD-10-CM

## 2018-12-23 LAB — LIPID PANEL
CHOLESTEROL: 116 mg/dL (ref 0–200)
HDL: 48 mg/dL (ref >40)
LDL Cholesterol: 61 mg/dL (ref 0–99)
Total CHOL/HDL Ratio: 2.4 RATIO
Triglycerides: 33 mg/dL (ref <150)
VLDL: 7 mg/dL (ref 0–40)

## 2018-12-23 LAB — CBC
HCT: 32.1 % — ABNORMAL LOW (ref 36.0–46.0)
Hemoglobin: 10.1 g/dL — ABNORMAL LOW (ref 12.0–15.0)
MCH: 31.2 pg (ref 26.0–34.0)
MCHC: 31.5 g/dL (ref 30.0–36.0)
MCV: 99.1 fL (ref 80.0–100.0)
Platelets: 191 10*3/uL (ref 150–400)
RBC: 3.24 MIL/uL — ABNORMAL LOW (ref 3.87–5.11)
RDW: 13.1 % (ref 11.5–15.5)
WBC: 6.7 10*3/uL (ref 4.0–10.5)
nRBC: 0 % (ref 0.0–0.2)

## 2018-12-23 LAB — BASIC METABOLIC PANEL
Anion gap: 6 (ref 5–15)
BUN: 31 mg/dL — ABNORMAL HIGH (ref 8–23)
CO2: 24 mmol/L (ref 22–32)
Calcium: 9.1 mg/dL (ref 8.9–10.3)
Chloride: 104 mmol/L (ref 98–111)
Creatinine, Ser: 1.04 mg/dL — ABNORMAL HIGH (ref 0.44–1.00)
GFR calc Af Amer: 54 mL/min — ABNORMAL LOW (ref >60)
GFR calc non Af Amer: 47 mL/min — ABNORMAL LOW (ref >60)
Glucose, Bld: 104 mg/dL — ABNORMAL HIGH (ref 70–99)
POTASSIUM: 4.4 mmol/L (ref 3.5–5.1)
Sodium: 134 mmol/L — ABNORMAL LOW (ref 135–145)

## 2018-12-23 LAB — I-STAT TROPONIN, ED: TROPONIN I, POC: 0.01 ng/mL (ref 0.00–0.08)

## 2018-12-23 LAB — TROPONIN I
Troponin I: <0.03 ng/mL (ref <0.03)
Troponin I: <0.03 ng/mL (ref <0.03)

## 2018-12-23 LAB — HEMOGLOBIN A1C
Hgb A1c MFr Bld: 5.8 % — ABNORMAL HIGH (ref 4.8–5.6)
Mean Plasma Glucose: 119.76 mg/dL

## 2018-12-23 LAB — TSH: TSH: 1.769 u[IU]/mL (ref 0.350–4.500)

## 2018-12-23 MED ORDER — LEVOTHYROXINE SODIUM 50 MCG PO TABS
50.0000 ug | ORAL_TABLET | Freq: Every day | ORAL | Status: DC
  Administered 2018-12-23: 50 ug via ORAL
  Filled 2018-12-23: qty 1

## 2018-12-23 MED ORDER — ONDANSETRON HCL 4 MG/2ML IJ SOLN: 4.0000 mg | Freq: Four times a day (QID) | INTRAMUSCULAR | Status: DC | PRN

## 2018-12-23 MED ORDER — ISOSORBIDE MONONITRATE ER 30 MG PO TB24
30.0000 mg | ORAL_TABLET | Freq: Every day | ORAL | Status: DC
  Administered 2018-12-23: 30 mg via ORAL
  Filled 2018-12-23: qty 1

## 2018-12-23 MED ORDER — LOSARTAN POTASSIUM 50 MG PO TABS
100.0000 mg | ORAL_TABLET | Freq: Every day | ORAL | Status: DC
  Administered 2018-12-23: 100 mg via ORAL
  Filled 2018-12-23: qty 2

## 2018-12-23 MED ORDER — NITROGLYCERIN 0.4 MG SL SUBL: 0.4000 mg | SUBLINGUAL_TABLET | SUBLINGUAL | Status: DC | PRN

## 2018-12-23 MED ORDER — SODIUM CHLORIDE 0.9 % IV SOLN
INTRAVENOUS | Status: DC
  Administered 2018-12-23: 03:00:00 via INTRAVENOUS

## 2018-12-23 MED ORDER — ASPIRIN 81 MG PO CHEW
81.0000 mg | CHEWABLE_TABLET | Freq: Every day | ORAL | Status: DC
  Administered 2018-12-23: 81 mg via ORAL
  Filled 2018-12-23: qty 1

## 2018-12-23 MED ORDER — PANTOPRAZOLE SODIUM 40 MG PO TBEC
40.0000 mg | DELAYED_RELEASE_TABLET | Freq: Every day | ORAL | Status: DC
  Administered 2018-12-23: 40 mg via ORAL
  Filled 2018-12-23: qty 1

## 2018-12-23 MED ORDER — MORPHINE SULFATE (PF) 2 MG/ML IV SOLN: 0.5000 mg | INTRAVENOUS | Status: DC | PRN

## 2018-12-23 MED ORDER — ENOXAPARIN SODIUM 40 MG/0.4ML ~~LOC~~ SOLN
40.0000 mg | SUBCUTANEOUS | Status: DC
  Administered 2018-12-23: 40 mg via SUBCUTANEOUS
  Filled 2018-12-23: qty 0.4

## 2018-12-23 MED ORDER — METOPROLOL TARTRATE 25 MG PO TABS
25.0000 mg | ORAL_TABLET | Freq: Two times a day (BID) | ORAL | Status: DC
  Administered 2018-12-23 (×2): 25 mg via ORAL
  Filled 2018-12-23 (×2): qty 1

## 2018-12-23 MED ORDER — ISOSORBIDE MONONITRATE ER 30 MG PO TB24: 30.0000 mg | ORAL_TABLET | Freq: Every day | ORAL | 1 refills | Status: DC

## 2018-12-23 MED ORDER — ADULT MULTIVITAMIN W/MINERALS CH
1.0000 | ORAL_TABLET | Freq: Every day | ORAL | Status: DC
  Administered 2018-12-23: 1 via ORAL
  Filled 2018-12-23: qty 1

## 2018-12-23 MED ORDER — ACETAMINOPHEN 325 MG PO TABS: 650.0000 mg | ORAL_TABLET | ORAL | Status: DC | PRN

## 2018-12-23 MED ORDER — HYDROCORTISONE NA SUCCINATE PF 100 MG IJ SOLR: 50.0000 mg | Freq: Once | INTRAMUSCULAR | Status: DC

## 2018-12-23 MED ORDER — ENOXAPARIN SODIUM 30 MG/0.3ML ~~LOC~~ SOLN: 30.0000 mg | SUBCUTANEOUS | Status: DC

## 2018-12-23 NOTE — Evaluation (Signed)
Physical Therapy Evaluation Patient Details Name: [name] MRN: [id number] DOB: 08/05/1925 Today's Date: 12/23/2018   History of Present Illness  85yo female presenting with chest pain worsening and non-radiating and improved after taking 3 nitroglycerin. Troponins, chest x-ray, and EKG all negative for acute changes. PMH hx CABG, HTN, hx cardiac cath   Clinical Impression   Patient received in bed, very pleasant and willing to participate in skilled PT session today. Able to complete bed mobility with Mod(I) and functional transfers and gait approximately 156f with SPC with min guard today. She demonstrates a very kyphotic posture but is able to maintain her balance well with dynamic activities in general, did require cues for safety not to push on table/desk with wheels in her room however. She was left sitting at EOB per her request with all needs met and daughter present. She will continue to benefit from skilled PT services in the acute setting, however do not feel that she will have needs for skilled PT follow-up after DC.     Follow Up Recommendations No PT follow up    Equipment Recommendations  None recommended by PT    Recommendations for Other Services       Precautions / Restrictions Precautions Precautions: Fall Restrictions Weight Bearing Restrictions: No      Mobility  Bed Mobility Overal bed mobility: Modified Independent             General bed mobility comments: able to perform supine to sit with no physical assist  Transfers Overall transfer level: Needs assistance Equipment used: Straight cane Transfers: Sit to/from Stand Sit to Stand: Min guard         General transfer comment: min guard for safety and inital steadying once up, patient/daughter report this is her baseline   Ambulation/Gait Ambulation/Gait assistance: Min guard Gait Distance (Feet): 140 Feet Assistive device: Straight cane Gait Pattern/deviations: Step-through  pattern;Decreased step length - right;Decreased step length - left;Trunk flexed;Narrow base of support Gait velocity: decreased    General Gait Details: slow but steady pace with SPC, good safety awareness throughout gait even on turns and able to bend over and scratch ankle without significant balance deficit   Stairs            Wheelchair Mobility    Modified Rankin (Stroke Patients Only)       Balance Overall balance assessment: Mild deficits observed, not formally tested                                           Pertinent Vitals/Pain Pain Assessment: 0-10 Pain Score: 3  Pain Location: sinuses  Pain Descriptors / Indicators: Aching;Burning Pain Intervention(s): Monitored during session    Home Living Family/patient expects to be discharged to:: Private residence Living Arrangements: Other relatives;Children Available Help at Discharge: Family;Available 24 hours/day Type of Home: House Home Access: Stairs to enter;Ramped entrance Entrance Stairs-Rails: None Entrance Stairs-Number of Steps: 2 STE with no railing but does have ramped enterance  Home Layout: One level Home Equipment: Walker - 2 wheels;Cane - single point;Wheelchair - manual Additional Comments: lives with son and grandson, daughter lives immediately next door and is also able to assist     Prior Function Level of Independence: Independent with assistive device(s)         Comments: uses no device in home and SPC outside of home  Hand Dominance   Dominant Hand: Right    Extremity/Trunk Assessment   Upper Extremity Assessment Upper Extremity Assessment: Generalized weakness    Lower Extremity Assessment Lower Extremity Assessment: Generalized weakness    Cervical / Trunk Assessment Cervical / Trunk Assessment: Kyphotic  Communication   Communication: No difficulties  Cognition Arousal/Alertness: Awake/alert Behavior During Therapy: WFL for tasks  assessed/performed Overall Cognitive Status: Within Functional Limits for tasks assessed                                        General Comments      Exercises     Assessment/Plan    PT Assessment Patient needs continued PT services  PT Problem List Decreased strength;Decreased balance;Decreased mobility;Decreased activity tolerance;Decreased coordination;Decreased safety awareness       PT Treatment Interventions DME instruction;Functional mobility training;Balance training;Patient/family education;Gait training;Therapeutic activities;Neuromuscular re-education;Stair training;Therapeutic exercise    PT Goals (Current goals can be found in the Care Plan section)  Acute Rehab PT Goals Patient Stated Goal: go home  PT Goal Formulation: With patient/family Time For Goal Achievement: 01/06/19 Potential to Achieve Goals: Good    Frequency Min 3X/week   Barriers to discharge        Co-evaluation               AM-PAC PT "6 Clicks" Mobility  Outcome Measure Help needed turning from your back to your side while in a flat bed without using bedrails?: None Help needed moving from lying on your back to sitting on the side of a flat bed without using bedrails?: None Help needed moving to and from a bed to a chair (including a wheelchair)?: A Little Help needed standing up from a chair using your arms (e.g., wheelchair or bedside chair)?: A Little Help needed to walk in hospital room?: A Little Help needed climbing 3-5 steps with a railing? : A Little 6 Click Score: 20    End of Session   Activity Tolerance: Patient tolerated treatment well Patient left: in bed;with call bell/phone within reach;with family/visitor present   PT Visit Diagnosis: Muscle weakness (generalized) (M62.81)    Time: 1000-1015 PT Time Calculation (min) (ACUTE ONLY): 15 min   Charges:   PT Evaluation $PT Eval Low Complexity: 1 Low          Deniece Ree PT, DPT,  CBIS  Supplemental Physical Therapist [place]    Pager [phone] Acute Rehab Office [phone]

## 2018-12-23 NOTE — Discharge Summary (Signed)
Physician Discharge Summary  [name] MGQ:[id number] DOB: January 18, 1925 DOA: 12/22/2018  PCP: Celene Squibb, MD  Admit date: 12/22/2018 Discharge date: 12/23/2018  Time spent: 45 minutes  Recommendations for Outpatient Follow-up:  1. Follow up with [name] with cardiology 4 weeks. Have sent request via epic 2. Take medications as directed   Discharge Diagnoses:  Principal Problem:   Chest pain Active Problems:   ANEMIA, NORMOCYTIC   Hx of CABG   GERD without esophagitis   Benign essential HTN   Hypothyroidism   Chronic kidney disease (CKD), stage II (mild)   Discharge Condition: stable and pain free  Diet recommendation: heart healthy  Filed Weights   12/22/18 2348 12/23/18 0337  Weight: 49.9 kg 49 kg    History of present illness:  [name] has hx cad s/p CABG, HTN, HLD was in [place] when had sudden onset substernal "achy pressure pain" without radiation, dyspnea, nausea or vomiting. Lasted for 15-20 minutes and resolved after SL nitro x 3. Similar to prior chronic chest pain but more intense. Came to ER and ruled out. Troponin negative. EKG NSR and chronic RBBB, no acute changes. CXR without acute abnormality. BP elevated at 160-180s. Chest pain free since admit on evening of 3/22. LDL 61. Scr 1.04. A1c 5.8. TSH normal.     Hospital Course:   Chest pain and hx of CAD: s/p of CBAG 2007. EKG has no significant change.  Initial troponin negative. No events on tele. Pain free since admission. Lipid panel within limits of normal. Normal stress test 2015.  Evaluated by cardiology who recommend imdur and discharge with OP follow up in 4 weeks. Of note, chart review indicated primary cardiology plan is for medical management. Consider ruling out in ED and discharging from there in future. Ambulated in hall just before discharge without pain  ANEMIA, NORMOCYTIC: -OP follow up. No active bleeding  GERD without esophagitis: -Protonix  HTN: controlled Continue home medications:  Metoprolol, Cozaar  Hypothyroidism: - continue synthroid tsh 1.76  Chronic kidney disease (CKD), stage II (mild): close to baseline. Cre 1.04/BUN 31, ratio indicating dehydration, GFR 47    Procedures:    Consultations:  [name] cardiology  Discharge Exam: Vitals:   12/23/18 0337 12/23/18 0823  BP: (!) 172/66 (!) 182/60  Pulse: 62 61  Resp: 18 18  Temp: 97.8 F (36.6 [name]) 98 F (36.7 [name])  SpO2:  98%    General: awake alert in no acute distress Cardiovascular: rrr no mgr no LE edema Respiratory: normal effort BS clear bilaterally no wheeze  Discharge Instructions   Discharge Instructions    Ambulatory referral to Cardiology   Complete by:  As directed    4 week post hospital office visit   Call MD for:  difficulty breathing, headache or visual disturbances   Complete by:  As directed    Call MD for:  persistant dizziness or light-headedness   Complete by:  As directed    Call MD for:  persistant nausea and vomiting   Complete by:  As directed    Diet - low sodium heart healthy   Complete by:  As directed    Discharge instructions   Complete by:  As directed    Take medications as directed.  Follow up with Dr [name] in 4 weeks Cardiology office will contact you to schedule   Increase activity slowly   Complete by:  As directed      Allergies as of 12/23/2018  Reactions   Lipitor [atorvastatin]    Aspirin Swelling   REACTION: High dose gives lip swelling   Celecoxib Other (See Comments)   REACTION: GI upset   Levaquin [levofloxacin In D5w] Other (See Comments)   nausea   Sulfa Antibiotics Rash   Sulfonamide Derivatives Rash   REACTION: Rash      Medication List    TAKE these medications   aspirin 81 MG chewable tablet Chew 81 mg by mouth at bedtime.   Dexilant 60 MG capsule Generic drug:  dexlansoprazole Take 60 mg by mouth daily.   isosorbide mononitrate 30 MG 24 hr tablet Commonly known as:  IMDUR Take 1 tablet (30 mg total) by mouth  daily. Start taking on:  December 24, 2018   levothyroxine 50 MCG tablet Commonly known as:  SYNTHROID, LEVOTHROID Take 50 mcg by mouth daily before breakfast.   losartan 100 MG tablet Commonly known as:  COZAAR Take 100 mg by mouth daily.   metoprolol tartrate 25 MG tablet Commonly known as:  LOPRESSOR TAKE ONE TABLET BY MOUTH TWICE A DAY   multivitamin with minerals Tabs tablet Take 1 tablet by mouth daily.   nitroGLYCERIN 0.4 MG SL tablet Commonly known as:  Nitrostat Place 1 tablet (0.4 mg total) under the tongue every 5 (five) minutes as needed. What changed:  reasons to take this   PRESERVISION AREDS 2 PO Take 1 tablet by mouth 2 (two) times daily.      Allergies  Allergen Reactions  . Lipitor [Atorvastatin]   . Aspirin Swelling    REACTION: High dose gives lip swelling  . Celecoxib Other (See Comments)    REACTION: GI upset  . Levaquin [Levofloxacin In D5w] Other (See Comments)    nausea  . Sulfa Antibiotics Rash  . Sulfonamide Derivatives Rash    REACTION: Rash   Follow-up Information    Alison Quan, PA-[name]. Go on 01/30/2019.   Specialties:  Cardiology, Radiology Why:  @3 :30pm for hospital follow up with Dr. Lurline Del PA Contact information: [address] [place] [address] [phone]            The results of significant diagnostics from this hospitalization (including imaging, microbiology, ancillary and laboratory) are listed below for reference.    Significant Diagnostic Studies: Dg Chest 2 View  Result Date: 12/23/2018 CLINICAL DATA:  85 year old female with chest pain radiating to her back since 2200 hours. EXAM: CHEST - 2 VIEW COMPARISON:  Chest radiographs 11/21/2006. lumbar MRI 05/16/2007. FINDINGS: Chronic large lung volumes. Prior CABG. Mediastinal contours are within normal limits. Calcified aortic atherosclerosis. Visualized tracheal air column is within normal limits. No pneumothorax, pulmonary edema, pleural effusion or  confluent pulmonary opacity. Osteopenia with partially visible compression fractures in the lower thoracic and upper lumbar spine which are new since 2008. Negative visible bowel gas pattern. IMPRESSION: 1. No acute cardiopulmonary abnormality. 2. Osteopenia with age indeterminate lower thoracic and upper lumbar compression fractures. 3.  Aortic Atherosclerosis (ICD10-I70.0). Electronically Signed   By: Genevie Ann M.D.   On: 12/23/2018 00:25    Microbiology: No results found for this or any previous visit (from the past 240 hour(s)).   Labs: Basic Metabolic Panel: Recent Labs  Lab 12/23/18 0025  NA 134*  K 4.4  CL 104  CO2 24  GLUCOSE 104*  BUN 31*  CREATININE 1.04*  CALCIUM 9.1   Liver Function Tests: No results for input(s): AST, ALT, ALKPHOS, BILITOT, PROT, ALBUMIN in the last 168 hours. No  results for input(s): LIPASE, AMYLASE in the last 168 hours. No results for input(s): AMMONIA in the last 168 hours. CBC: Recent Labs  Lab 12/23/18 0025  WBC 6.7  HGB 10.1*  HCT 32.1*  MCV 99.1  PLT 191   Cardiac Enzymes: Recent Labs  Lab 12/23/18 0302  TROPONINI <0.03   BNP: BNP (last 3 results) No results for input(s): BNP in the last 8760 hours.  ProBNP (last 3 results) No results for input(s): PROBNP in the last 8760 hours.  CBG: No results for input(s): GLUCAP in the last 168 hours.     SignedRadene Gunning NP [facility] 12/23/2018, 11:10 AM

## 2018-12-23 NOTE — ED Notes (Signed)
ED TO INPATIENT HANDOFF REPORT  ED Nurse Name and Phone #:  Herbie Baltimore RN   S Name/Age/Gender [name] 85 y.o. female Room/Bed: 024C/024C  Code Status   Code Status: Full Code  Home/SNF/Other : HOME  {Patient oriented to: PERSON/PLACE/TIME/SITUATION Is this baseline?  YES  Triage Complete: Triage complete  Chief Complaint chest pain  Triage Note Per pt she has been having chest pain tonight all across and then went into her back. This started about 10 pm tonight. No nausea, no vomiting, Some SOB, Did take 3 nitro at home with relief. Pt is not having chest pain at this time   Allergies Allergies  Allergen Reactions  . Lipitor [Atorvastatin]   . Aspirin Swelling    REACTION: High dose gives lip swelling  . Celecoxib Other (See Comments)    REACTION: GI upset  . Levaquin [Levofloxacin In D5w] Other (See Comments)    nausea  . Sulfa Antibiotics Rash  . Sulfonamide Derivatives Rash    REACTION: Rash    Level of Care/Admitting Diagnosis ED Disposition    ED Disposition Condition Comment   Admit  Hospital Area: [facility] [[id number]]  Level of Care: Telemetry Cardiac [103]  I expect the patient will be discharged within 24 hours: No (not a candidate for 5C-Observation unit)  Diagnosis: Chest pain [323557]  Admitting Physician: Ivor Costa [[id number]]  Attending Physician: Ivor Costa [[id number]]  PT Class (Do Not Modify): Observation [104]  PT Acc Code (Do Not Modify): Observation [10022]       B Medical/Surgery History Past Medical History:  Diagnosis Date  . CAD (coronary artery disease)    CABG 10/28/2005 LIMA to LAD,SVG to OM,SVG to distal RCA  . Cancer (HCC)    vaginal  . Carotid atherosclerosis    minor  . Coronary artery disease   . Hyperlipidemia   . Hypertension   . Systemic hypertension    Past Surgical History:  Procedure Laterality Date  . ABDOMINAL HYSTERECTOMY    . ABDOMINAL HYSTERECTOMY  1966  . CARDIAC CATHETERIZATION  10/27/2005    left main and severe 2 vessel disease.  [facility] CATARACT EXTRACTION  1998 & 2004  . CESAREAN SECTION    . CHOLECYSTECTOMY    . CORONARY ARTERY BYPASS GRAFT    . CORONARY ARTERY BYPASS GRAFT  10/28/2005   LIMA to LAD,SVG to OM,SVG to distal RCA  . NM MYOCAR PERF WALL MOTION  01/11/2010   normal  . US ECHOCARDIOGRAPHY  08/01/2011   mild to mod TR, ca+ AOV leaflets, AOV mildly sclerotic     A IV Location/Drains/Wounds Patient Lines/Drains/Airways Status   Active Line/Drains/Airways    Name:   Placement date:   Placement time:   Site:   Days:   Peripheral IV 12/23/18 Right Antecubital   12/23/18    0252    Antecubital   less than 1          Intake/Output Last 24 hours No intake or output data in the 24 hours ending 12/23/18 0257  Labs/Imaging Results for orders placed or performed during the hospital encounter of 12/22/18 (from the past 48 hour(s))  Basic metabolic panel     Status: Abnormal   Collection Time: 12/23/18 12:25 AM  Result Value Ref Range   Sodium 134 (L) 135 - 145 mmol/L   Potassium 4.4 3.5 - 5.1 mmol/L   Chloride 104 98 - 111 mmol/L   CO2 24 22 - 32 mmol/L   Glucose, Bld 104 (  H) 70 - 99 mg/dL   BUN 31 (H) 8 - 23 mg/dL   Creatinine, Ser 1.04 (H) 0.44 - 1.00 mg/dL   Calcium 9.1 8.9 - 10.3 mg/dL   GFR calc non Af Amer 47 (L) >60 mL/min   GFR calc Af Amer 54 (L) >60 mL/min   Anion gap 6 5 - 15    Comment: Performed at [facility] [address] [place], [place] [address]  CBC     Status: Abnormal   Collection Time: 12/23/18 12:25 AM  Result Value Ref Range   WBC 6.7 4.0 - 10.5 K/uL   RBC 3.24 (L) 3.87 - 5.11 MIL/uL   Hemoglobin 10.1 (L) 12.0 - 15.0 g/dL   HCT 32.1 (L) 36.0 - 46.0 %   MCV 99.1 80.0 - 100.0 fL   MCH 31.2 26.0 - 34.0 pg   MCHC 31.5 30.0 - 36.0 g/dL   RDW 13.1 11.5 - 15.5 %   Platelets 191 150 - 400 K/uL   nRBC 0.0 0.0 - 0.2 %    Comment: Performed at [facility], [place] [address] [place], [place] [address]  I-stat troponin, ED      Status: None   Collection Time: 12/23/18 12:30 AM  Result Value Ref Range   Troponin i, poc 0.01 0.00 - 0.08 ng/mL   Comment 3            Comment: Due to the release kinetics of cTnI, a negative result within the first hours of the onset of symptoms does not rule out myocardial infarction with certainty. If myocardial infarction is still suspected, repeat the test at appropriate intervals.    Dg Chest 2 View  Result Date: 12/23/2018 CLINICAL DATA:  85 year old female with chest pain radiating to her back since 2200 hours. EXAM: CHEST - 2 VIEW COMPARISON:  Chest radiographs 11/21/2006. lumbar MRI 05/16/2007. FINDINGS: Chronic large lung volumes. Prior CABG. Mediastinal contours are within normal limits. Calcified aortic atherosclerosis. Visualized tracheal air column is within normal limits. No pneumothorax, pulmonary edema, pleural effusion or confluent pulmonary opacity. Osteopenia with partially visible compression fractures in the lower thoracic and upper lumbar spine which are new since 2008. Negative visible bowel gas pattern. IMPRESSION: 1. No acute cardiopulmonary abnormality. 2. Osteopenia with age indeterminate lower thoracic and upper lumbar compression fractures. 3.  Aortic Atherosclerosis (ICD10-I70.0). Electronically Signed   By: Genevie Ann M.D.   On: 12/23/2018 00:25    Pending Labs Unresulted Labs (From admission, onward)    Start     Ordered   12/23/18 0500  Hemoglobin A1c  Tomorrow morning,   R     12/23/18 0239   12/23/18 0500  Lipid panel  Tomorrow morning,   R    Comments:  Please obtain as a fasting lipid panel - should not have eaten/ drank food for 8 hours prior to labs.    12/23/18 0239   12/23/18 0240  Troponin I - Now Then Q6H  Now then every 6 hours,   R     12/23/18 0239          Vitals/Pain Today's Vitals   12/23/18 0149 12/23/18 0215 12/23/18 0230 12/23/18 0245  BP: 140/72 (!) 158/45 (!) 169/46 (!) 164/47  Pulse: (!) 59 (!) 59 64 (!) 58  Resp: 18 13  (!) 21 14  Temp:      TempSrc:      SpO2: 100% 98% 100% 98%  Weight:  Height:      PainSc:        Isolation Precautions No active isolations  Medications Medications  losartan (COZAAR) tablet 100 mg (has no administration in time range)  metoprolol tartrate (LOPRESSOR) tablet 25 mg (25 mg Oral Given 12/23/18 0251)  aspirin chewable tablet 81 mg (has no administration in time range)  nitroGLYCERIN (NITROSTAT) SL tablet 0.4 mg (has no administration in time range)  levothyroxine (SYNTHROID, LEVOTHROID) tablet 50 mcg (has no administration in time range)  pantoprazole (PROTONIX) EC tablet 40 mg (has no administration in time range)  multivitamin with minerals tablet 1 tablet (has no administration in time range)  morphine 2 MG/ML injection 0.5 mg (has no administration in time range)  0.9 %  sodium chloride infusion ( Intravenous New Bag/Given 12/23/18 0253)  acetaminophen (TYLENOL) tablet 650 mg (has no administration in time range)  enoxaparin (LOVENOX) injection 40 mg (has no administration in time range)    Mobility walks Low fall risk   Focused Assessments NO CHEST PAIN / RESPIRATIONS UNLABORED .    R Recommendations: See Admitting Provider Note  Report given to:   Additional Notes:  FAMILY AT BEDSIDE

## 2018-12-23 NOTE — H&P (Signed)
History and Physical    [name] SWF:[id number] DOB: July 14, 1925 DOA: 12/22/2018  Referring MD/NP/PA:   PCP: Celene Squibb, MD   Patient coming from:  The patient is coming from home.  At baseline, pt is independent for most of ADL.        Chief Complaint: Chest pain  HPI: [name] is a 85 y.o. female with medical history significant of hypertension, hyperlipidemia, CAD, CABG 2007, vaginal cancer, CKD-2, right bundle blockage, GERD, hypothyroidism, who presents with chest pain.  Patient states that she has chronic intermittent mild chest pain for years, but it changed since last night.  She developed worsening chest pain, which is located in the front chest, pressure-like, nonradiating, initially 7 out of 10 in severity, currently chest pain-free after she took 3 nitroglycerin.  She reported to ED physician that she had mild shortness of breath, but she denies any shortness of breath to me. No palpitation.  No recent long distance traveling.  Denies fever, chills.  Patient does not have nausea, vomiting, diarrhea, abdominal pain, symptoms of UTI or unilateral weakness.  ED Course: pt was found to have negative troponin, WBC 6.7, creatinine 1.04, BUN 31, temperature normal, bradycardia, elevated blood pressure181/55-->140/72, oxygen saturation 98% on room air.  Chest x-ray negative.  Patient is placed on telemetry bed for observation.  Review of Systems:   General: no fevers, chills, no body weight gain, has fatigue HEENT: no blurry vision, hearing changes or sore throat Respiratory: no dyspnea, coughing, wheezing CV: has chest pain, no palpitations GI: no nausea, vomiting, abdominal pain, diarrhea, constipation GU: no dysuria, burning on urination, increased urinary frequency, hematuria  Ext: has trace ankle edema Neuro: no unilateral weakness, numbness, or tingling, no vision change or hearing loss Skin: no rash, no skin tear. MSK: No muscle spasm, no deformity, no limitation of  range of movement in spin Heme: No easy bruising.  Travel history: No recent long distant travel.  Allergy:  Allergies  Allergen Reactions  . Lipitor [Atorvastatin]   . Aspirin Swelling    REACTION: High dose gives lip swelling  . Celecoxib Other (See Comments)    REACTION: GI upset  . Levaquin [Levofloxacin In D5w] Other (See Comments)    nausea  . Sulfa Antibiotics Rash  . Sulfonamide Derivatives Rash    REACTION: Rash    Past Medical History:  Diagnosis Date  . CAD (coronary artery disease)    CABG 10/28/2005 LIMA to LAD,SVG to OM,SVG to distal RCA  . Cancer (HCC)    vaginal  . Carotid atherosclerosis    minor  . Coronary artery disease   . Hyperlipidemia   . Hypertension   . Systemic hypertension     Past Surgical History:  Procedure Laterality Date  . ABDOMINAL HYSTERECTOMY    . ABDOMINAL HYSTERECTOMY  1966  . CARDIAC CATHETERIZATION  10/27/2005   left main and severe 2 vessel disease.  [facility] Kitchen CATARACT EXTRACTION  1998 & 2004  . CESAREAN SECTION    . CHOLECYSTECTOMY    . CORONARY ARTERY BYPASS GRAFT    . CORONARY ARTERY BYPASS GRAFT  10/28/2005   LIMA to LAD,SVG to OM,SVG to distal RCA  . NM MYOCAR PERF WALL MOTION  01/11/2010   normal  . US ECHOCARDIOGRAPHY  08/01/2011   mild to mod TR, ca+ AOV leaflets, AOV mildly sclerotic    Social History:  reports that she has never smoked. She has never used smokeless tobacco. She reports that she does not  drink alcohol or use drugs.  Family History:  Family History  Problem Relation Age of Onset  . Stroke Father   . Heart attack Sister   . Heart attack Brother   . Heart attack Brother   . Cancer Brother   . Cancer Brother   . Diabetes Brother   . Cancer Sister      Prior to Admission medications   Medication Sig Start Date End Date Taking? Authorizing Provider  aspirin 81 MG chewable tablet Chew 81 mg by mouth at bedtime.   Yes [provider]  DEXILANT 60 MG capsule Take 60 mg by mouth daily.  09/06/13   Yes [provider]  levothyroxine (SYNTHROID, LEVOTHROID) 50 MCG tablet Take 50 mcg by mouth daily before breakfast.  11/16/17  Yes [provider]  losartan (COZAAR) 100 MG tablet Take 100 mg by mouth daily.   Yes [provider]  metoprolol tartrate (LOPRESSOR) 25 MG tablet TAKE ONE TABLET BY MOUTH TWICE A DAY Patient taking differently: Take 25 mg by mouth 2 (two) times daily.  12/09/18  Yes Croitoru, Mihai, MD  Multiple Vitamin (MULTIVITAMIN WITH MINERALS) TABS tablet Take 1 tablet by mouth daily.   Yes [provider]  Multiple Vitamins-Minerals (PRESERVISION AREDS 2 PO) Take 1 tablet by mouth 2 (two) times daily.   Yes [provider]  nitroGLYCERIN (NITROSTAT) 0.4 MG SL tablet Place 1 tablet (0.4 mg total) under the tongue every 5 (five) minutes as needed. Patient taking differently: Place 0.4 mg under the tongue every 5 (five) minutes as needed for chest pain.  01/25/17  Yes Croitoru, Dani Gobble, MD    Physical Exam: Vitals:   12/23/18 0149 12/23/18 0215 12/23/18 0230 12/23/18 0245  BP: 140/72 (!) 158/45 (!) 169/46 (!) 164/47  Pulse: (!) 59 (!) 59 64 (!) 58  Resp: 18 13 (!) 21 14  Temp:      TempSrc:      SpO2: 100% 98% 100% 98%  Weight:      Height:       General: Not in acute distress HEENT:       Eyes: PERRL, EOMI, no scleral icterus.       ENT: No discharge from the ears and nose, no pharynx injection, no tonsillar enlargement.        Neck: No JVD, no bruit, no mass felt. Heme: No neck lymph node enlargement. Cardiac: S1/S2, RRR, No murmurs, No gallops or rubs. Respiratory: No rales, wheezing, rhonchi or rubs. GI: Soft, nondistended, nontender, no rebound pain, no organomegaly, BS present. GU: No hematuria Ext: has trace ankle edema bilaterally. 2+DP/PT pulse bilaterally. Musculoskeletal: No joint deformities, No joint redness or warmth, no limitation of ROM in spin. Skin: No rashes.  Neuro: Alert, oriented X3, cranial nerves  II-XII grossly intact, moves all extremities normally. Psych: Patient is not psychotic, no suicidal or hemocidal ideation.  Labs on Admission: I have personally reviewed following labs and imaging studies  CBC: Recent Labs  Lab 12/23/18 0025  WBC 6.7  HGB 10.1*  HCT 32.1*  MCV 99.1  PLT 573   Basic Metabolic Panel: Recent Labs  Lab 12/23/18 0025  NA 134*  K 4.4  CL 104  CO2 24  GLUCOSE 104*  BUN 31*  CREATININE 1.04*  CALCIUM 9.1   GFR: Estimated Creatinine Clearance: 27.8 mL/min (A) (by C-G formula based on SCr of 1.04 mg/dL (H)). Liver Function Tests: No results for input(s): AST, ALT, ALKPHOS, BILITOT, PROT, ALBUMIN in the last  168 hours. No results for input(s): LIPASE, AMYLASE in the last 168 hours. No results for input(s): AMMONIA in the last 168 hours. Coagulation Profile: No results for input(s): INR, PROTIME in the last 168 hours. Cardiac Enzymes: No results for input(s): CKTOTAL, CKMB, CKMBINDEX, TROPONINI in the last 168 hours. BNP (last 3 results) No results for input(s): PROBNP in the last 8760 hours. HbA1C: No results for input(s): HGBA1C in the last 72 hours. CBG: No results for input(s): GLUCAP in the last 168 hours. Lipid Profile: No results for input(s): CHOL, HDL, LDLCALC, TRIG, CHOLHDL, LDLDIRECT in the last 72 hours. Thyroid Function Tests: No results for input(s): TSH, T4TOTAL, FREET4, T3FREE, THYROIDAB in the last 72 hours. Anemia Panel: No results for input(s): VITAMINB12, FOLATE, FERRITIN, TIBC, IRON, RETICCTPCT in the last 72 hours. Urine analysis:    Component Value Date/Time   COLORURINE YELLOW 06/21/2014 1510   APPEARANCEUR CLEAR 06/21/2014 1510   LABSPEC 1.015 06/21/2014 1510   PHURINE 6.5 06/21/2014 1510   GLUCOSEU NEGATIVE 06/21/2014 1510   HGBUR NEGATIVE 06/21/2014 1510   HGBUR negative 02/17/2008 1007   BILIRUBINUR NEGATIVE 06/21/2014 1510   KETONESUR NEGATIVE 06/21/2014 1510   PROTEINUR NEGATIVE 06/21/2014 1510    UROBILINOGEN 1.0 06/21/2014 1510   NITRITE NEGATIVE 06/21/2014 1510   LEUKOCYTESUR NEGATIVE 06/21/2014 1510   Sepsis Labs: @LABRCNTIP (procalcitonin:4,lacticidven:4) )No results found for this or any previous visit (from the past 240 hour(s)).   Radiological Exams on Admission: Dg Chest 2 View  Result Date: 12/23/2018 CLINICAL DATA:  85 year old female with chest pain radiating to her back since 2200 hours. EXAM: CHEST - 2 VIEW COMPARISON:  Chest radiographs 11/21/2006. lumbar MRI 05/16/2007. FINDINGS: Chronic large lung volumes. Prior CABG. Mediastinal contours are within normal limits. Calcified aortic atherosclerosis. Visualized tracheal air column is within normal limits. No pneumothorax, pulmonary edema, pleural effusion or confluent pulmonary opacity. Osteopenia with partially visible compression fractures in the lower thoracic and upper lumbar spine which are new since 2008. Negative visible bowel gas pattern. IMPRESSION: 1. No acute cardiopulmonary abnormality. 2. Osteopenia with age indeterminate lower thoracic and upper lumbar compression fractures. 3.  Aortic Atherosclerosis (ICD10-I70.0). Electronically Signed   By: Genevie Ann M.D.   On: 12/23/2018 00:25     EKG: Independently reviewed.  Sinus rhythm, QTC 499, right bundle blockade which is old, early R wave progression.  Assessment/Plan Principal Problem:   Chest pain Active Problems:   ANEMIA, NORMOCYTIC   Hx of CABG   GERD without esophagitis   Benign essential HTN   Hypothyroidism   Chronic kidney disease (CKD), stage II (mild)   Chest pain and hx of CAD: s/p of CBAG 2007. EKG has no significant change.  Initial troponin negative.   - will place on Tele bed for obs - cycle CE q6 x3 and repeat EKG in the am  - prn Nitroglycerin, Morphine, and aspirin, metoprolol - Risk factor stratification: will check FLP and A1C  - did not order 2d echo--> please reevaluate pt in AM to decide if pt needs 2D echo. - inpt card consult  was requested via Epic  ANEMIA, NORMOCYTIC: -f/u by CBC  GERD without esophagitis: -Protonix  HTN:  -Continue home medications: Metoprolol, Cozaar -IV hydralazine prn  Hypothyroidism: - continue synthroid   Chronic kidney disease (CKD), stage II (mild): close to baseline. Cre 1.04/BUN 31, ratio indicating dehydration, GFR 47 -f/u by BMP -NS at 75 cc/h    DVT ppx: SQ Lovenox Code Status: Full code Family Communication:  Yes,  patient's daughter   at bed side Disposition Plan:  Anticipate discharge back to previous home environment Consults called:  none Admission status: Obs / tele     Date of Service 12/23/2018    [facility]   If 7PM-7AM, please contact night-coverage [email or web address] Password [facility] 12/23/2018, 2:51 AM

## 2018-12-23 NOTE — ED Notes (Signed)
Dr. Stark Jock ( EDP) notified on pt.'s hypertension 181/54.

## 2018-12-23 NOTE — ED Provider Notes (Signed)
[facility] DEPARTMENT Provider Note   CSN: [id number] Arrival date & time: 12/22/18  2340    History   Chief Complaint Chief Complaint  Patient presents with  . Chest Pain    HPI [name] is a 85 y.o. female.     Patient is a 85 year old female with history of coronary artery disease status post CABG in 2007.  She presents today with complaints of chest discomfort.  This started this evening while she was lying down reading a book.  She describes an "ache" in the center of her chest with no associated shortness of breath, nausea, diaphoresis, or radiation.  Patient states that she took 3 sequential nitroglycerin which seemed to relieve her discomfort.  She is now currently pain-free and has no complaints.  The history is provided by the patient.  Chest Pain  Pain location:  Substernal area Pain quality: aching   Pain radiates to:  Does not radiate Pain severity:  Moderate Onset quality:  Sudden Duration:  30 minutes Timing:  Constant Progression:  Resolved Chronicity:  New Context: not breathing   Relieved by:  Nothing Worsened by:  Nothing Ineffective treatments:  None tried   Past Medical History:  Diagnosis Date  . CAD (coronary artery disease)    CABG 10/28/2005 LIMA to LAD,SVG to OM,SVG to distal RCA  . Cancer (HCC)    vaginal  . Carotid atherosclerosis    minor  . Coronary artery disease   . Hyperlipidemia   . Hypertension   . Systemic hypertension     Patient Active Problem List   Diagnosis Date Noted  . Palpitations 05/13/2018  . RBBB 05/13/2018  . Hyponatremia 06/21/2014  . Generalized weakness 06/21/2014  . Dehydration 06/21/2014  . Acute bronchitis 06/21/2014  . Fatigue 06/21/2014  . GERD without esophagitis 06/21/2014  . Benign essential HTN 06/21/2014  . Hypothyroidism 06/21/2014  . Hyperlipidemia 06/21/2014  . TRIGGER FINGER DEFORMITY 09/11/2008  . ALLERGIC RHINITIS, SEASONAL 02/17/2008  . CONSTIPATION  11/20/2007  . DEGENERATIVE DISC DISEASE 07/03/2007  . SPINAL STENOSIS 07/03/2007  . [place] DISEASE, CERVICAL 05/08/2007  . ANEMIA, NORMOCYTIC 11/23/2006  . Hx of CABG 11/21/2006  . Hyperlipidemia 09/06/2006  . MIGRAINE HEADACHE 09/06/2006  . Essential hypertension 09/06/2006  . GERD 09/06/2006  . OSTEOARTHRITIS 09/06/2006  . OSTEOPOROSIS 09/06/2006    Past Surgical History:  Procedure Laterality Date  . ABDOMINAL HYSTERECTOMY    . ABDOMINAL HYSTERECTOMY  1966  . CARDIAC CATHETERIZATION  10/27/2005   left main and severe 2 vessel disease.  Marland Kitchen CATARACT EXTRACTION  1998 & 2004  . CESAREAN SECTION    . CHOLECYSTECTOMY    . CORONARY ARTERY BYPASS GRAFT    . CORONARY ARTERY BYPASS GRAFT  10/28/2005   LIMA to LAD,SVG to OM,SVG to distal RCA  . NM MYOCAR PERF WALL MOTION  01/11/2010   normal  . US ECHOCARDIOGRAPHY  08/01/2011   mild to mod TR, ca+ AOV leaflets, AOV mildly sclerotic     OB History    Gravida  0   Para  0   Term  0   Preterm  0   AB  0   Living        SAB  0   TAB  0   Ectopic  0   Multiple      Live Births               Home Medications    Prior to Admission medications  Medication Sig Start Date End Date Taking? Authorizing Provider  acetaminophen (TYLENOL) 500 MG tablet Take 500 mg by mouth daily.    [provider]  Biotin (BIOTIN 5000) 5 MG CAPS Take 1 capsule by mouth daily.    [provider]  DEXILANT 60 MG capsule Take 60 mg by mouth daily.  09/06/13   [provider]  docusate sodium 100 MG CAPS Take 100 mg by mouth 2 (two) times daily. 06/24/14   Mikhail, Velta Addison, DO  furosemide (LASIX) 20 MG tablet Take 1 tablet by mouth as needed. 06/10/14   [provider]  gabapentin (NEURONTIN) 100 MG capsule Take 1 capsule by mouth daily. 06/10/14   [provider]  levothyroxine (SYNTHROID, LEVOTHROID) 50 MCG tablet  11/16/17   [provider]  losartan (COZAAR) 100 MG tablet Take 100 mg by  mouth daily.    [provider]  lovastatin (MEVACOR) 20 MG tablet Take 20 mg by mouth at bedtime.    [provider]  Magnesium 250 MG TABS Take 1 tablet by mouth daily.    [provider]  metoprolol tartrate (LOPRESSOR) 25 MG tablet TAKE ONE TABLET BY MOUTH TWICE A DAY 12/09/18   Croitoru, Mihai, MD  nitroGLYCERIN (NITROSTAT) 0.4 MG SL tablet Place 1 tablet (0.4 mg total) under the tongue every 5 (five) minutes as needed. 01/25/17   Croitoru, Mihai, MD  NON FORMULARY Take 1 tablet by mouth daily. MAGNA-CAL TABLETS    [provider]  NON FORMULARY Take 1 tablet by mouth daily. Leg cramps tablet    [provider]  potassium chloride (K-DUR) 10 MEQ tablet Take 1 tablet by mouth as needed. 06/10/14   [provider]    Family History Family History  Problem Relation Age of Onset  . Stroke Father   . Heart attack Sister   . Heart attack Brother   . Heart attack Brother   . Cancer Brother   . Cancer Brother   . Diabetes Brother   . Cancer Sister     Social History Social History   Tobacco Use  . Smoking status: Never Smoker  . Smokeless tobacco: Never Used  Substance Use Topics  . Alcohol use: No  . Drug use: No     Allergies   Lipitor [atorvastatin]; Aspirin; Celecoxib; Levaquin [levofloxacin in d5w]; Sulfa antibiotics; and Sulfonamide derivatives   Review of Systems Review of Systems  Cardiovascular: Positive for chest pain.  All other systems reviewed and are negative.    Physical Exam Updated Vital Signs BP (!) 175/57 (BP Location: Right Arm)   Pulse (!) 59   Temp 97.7 F (36.5 C) (Oral)   Resp 17   Ht 5\' 4"  (1.626 m)   Wt 49.9 kg   SpO2 99%   BMI 18.88 kg/m   Physical Exam Vitals signs and nursing note reviewed.  Constitutional:      General: She is not in acute distress.    Appearance: She is well-developed. She is not diaphoretic.  HENT:     Head: Normocephalic and atraumatic.  Neck:      Musculoskeletal: Normal range of motion and neck supple.  Cardiovascular:     Rate and Rhythm: Normal rate and regular rhythm.     Heart sounds: No murmur. No friction rub. No gallop.   Pulmonary:     Effort: Pulmonary effort is normal. No respiratory distress.     Breath sounds: Normal breath sounds. No wheezing.  Abdominal:  General: Bowel sounds are normal. There is no distension.     Palpations: Abdomen is soft.     Tenderness: There is no abdominal tenderness.  Musculoskeletal: Normal range of motion.     Right lower leg: She exhibits no tenderness. No edema.     Left lower leg: She exhibits no tenderness. No edema.  Skin:    General: Skin is warm and dry.  Neurological:     Mental Status: She is alert and oriented to person, place, and time.      ED Treatments / Results  Labs (all labs ordered are listed, but only abnormal results are displayed) Labs Reviewed  BASIC METABOLIC PANEL  CBC  I-STAT TROPONIN, ED    EKG EKG Interpretation  Date/Time:  Sunday December 22 2018 23:54:24 EDT Ventricular Rate:  60 PR Interval:    QRS Duration: 154 QT Interval:  499 QTC Calculation: 499 R Axis:   75 Text Interpretation:  Sinus rhythm Right bundle branch block Baseline wander in lead(s) III Confirmed by Veryl Speak [phone]) on 12/23/2018 12:02:45 AM   Radiology No results found.  Procedures Procedures (including critical care time)  Medications Ordered in ED Medications - No data to display   Initial Impression / Assessment and Plan / ED Course  I have reviewed the triage vital signs and the nursing notes.  Pertinent labs & imaging results that were available during my care of the patient were reviewed by me and considered in my medical decision making (see chart for details).  Patient presents with complaints of chest discomfort.  She has a history of coronary artery disease with CABG in 2007.  She presents today with an ache in her chest that started this evening  while going to bed.  It was relieved with nitroglycerin.  Work-up thus far is unremarkable including EKG and troponin.  Patient is currently pain-free.  Patient will be admitted to the hospitalist service for chest pain rule out.  Dr. Blaine Hamper agrees to admit.  Final Clinical Impressions(s) / ED Diagnoses   Final diagnoses:  None    ED Discharge Orders    None       Veryl Speak, MD 12/23/18 [phone]

## 2018-12-23 NOTE — Consult Note (Addendum)
The patient has been seen in conjunction with Vin Bhagat, PAC. All aspects of care have been considered and discussed. The patient has been personally interviewed, examined, and all clinical data has been reviewed.   Agree with recommendation to add Imdur and ambulate. If further pain, up-tirate the medication intensity.  If rules out by markers, okay to discharge later today or in AM.  ECG's so far show no evolutionary change.  F/U with primary cardiologist in 1 month or earlier if recurrent severe pain.  Plan no ischemic evaluation at this time unless symptoms become refractory.    Cardiology Consultation:   Patient ID: [name] MRN: [id number]; DOB: 1925-07-30  Admit date: 12/22/2018 Date of Consult: 12/23/2018  Primary Care Provider: Celene Squibb, MD Primary Cardiologist: Sanda Klein, MD   Patient Profile:   [name] is a 85 y.o. female with a hx of CAD s/p CABG, HTN, HLD who is being seen today for the evaluation of chest pain at the request of Dr. Blaine Hamper.   Hx of CABG 10/28/2005 LIMA to LAD,SVG to OM,SVG to distal RCA). She has not had new coronary events since then. Normal nuclear stress test in November 2015.  Per Dr. Sallyanne Kuster 11/08/2018 "She is 85 years old and would prefer noninvasive management.  Again reviewed the difference stable and unstable angina pectoris and when she should seek emergency medical attention (has pain at rest not relieved by 3 consecutive sublingual nitroglycerin or lasting more than 20-30 minutes)".   History of Present Illness:   Ms. Pech was in [place] when had sudden onset substernal "achy pressure pain" without radiation, dyspnea, nausea or vomiting. Lasted for 15-20 minutes and resolved after SL nitro x 3. Similar to prior chronic chest pain but more intense. Came to ER and ruled out. Troponin negative. EKG NSR and chronic RBBB, no acute changes - personally reviewed. CXR without acute abnormality. BP elevated at 160-180s. Chest pain free  since admit. LDL 61. Scr 1.04. A1c 5.8. TSH normal.    Denies palpitations, dizziness, orthopnea, PND, syncope, melena or dyspnea.   Past Medical History:  Diagnosis Date  . CAD (coronary artery disease)    CABG 10/28/2005 LIMA to LAD,SVG to OM,SVG to distal RCA  . Cancer (HCC)    vaginal  . Carotid atherosclerosis    minor  . Coronary artery disease   . Hyperlipidemia   . Hypertension   . Systemic hypertension     Past Surgical History:  Procedure Laterality Date  . ABDOMINAL HYSTERECTOMY    . ABDOMINAL HYSTERECTOMY  1966  . CARDIAC CATHETERIZATION  10/27/2005   left main and severe 2 vessel disease.  Marland Kitchen CATARACT EXTRACTION  1998 & 2004  . CESAREAN SECTION    . CHOLECYSTECTOMY    . CORONARY ARTERY BYPASS GRAFT    . CORONARY ARTERY BYPASS GRAFT  10/28/2005   LIMA to LAD,SVG to OM,SVG to distal RCA  . NM MYOCAR PERF WALL MOTION  01/11/2010   normal  . US ECHOCARDIOGRAPHY  08/01/2011   mild to mod TR, ca+ AOV leaflets, AOV mildly sclerotic     Inpatient Medications: Scheduled Meds: . aspirin  81 mg Oral QHS  . enoxaparin (LOVENOX) injection  40 mg Subcutaneous Q24H  . levothyroxine  50 mcg Oral Q0600  . losartan  100 mg Oral Daily  . metoprolol tartrate  25 mg Oral BID  . multivitamin with minerals  1 tablet Oral Daily  . pantoprazole  40 mg Oral Daily  Continuous Infusions: . sodium chloride 75 mL/hr at 12/23/18 0253   PRN Meds: acetaminophen, morphine injection, nitroGLYCERIN  Allergies:    Allergies  Allergen Reactions  . Lipitor [Atorvastatin]   . Aspirin Swelling    REACTION: High dose gives lip swelling  . Celecoxib Other (See Comments)    REACTION: GI upset  . Levaquin [Levofloxacin In D5w] Other (See Comments)    nausea  . Sulfa Antibiotics Rash  . Sulfonamide Derivatives Rash    REACTION: Rash    Social History:   Social History   Socioeconomic History  . Marital status: Married    Spouse name: Not on file  . Number of children: Not on file   . Years of education: Not on file  . Highest education level: Not on file  Occupational History  . Not on file  Social Needs  . Financial resource strain: Not on file  . Food insecurity:    Worry: Not on file    Inability: Not on file  . Transportation needs:    Medical: Not on file    Non-medical: Not on file  Tobacco Use  . Smoking status: Never Smoker  . Smokeless tobacco: Never Used  Substance and Sexual Activity  . Alcohol use: No  . Drug use: No  . Sexual activity: Not Currently  Lifestyle  . Physical activity:    Days per week: Not on file    Minutes per session: Not on file  . Stress: Not on file  Relationships  . Social connections:    Talks on phone: Not on file    Gets together: Not on file    Attends religious service: Not on file    Active member of club or organization: Not on file    Attends meetings of clubs or organizations: Not on file    Relationship status: Not on file  . Intimate partner violence:    Fear of current or ex partner: Not on file    Emotionally abused: Not on file    Physically abused: Not on file    Forced sexual activity: Not on file  Other Topics Concern  . Not on file  Social History Narrative   ** Merged History Encounter **        Family History:   Family History  Problem Relation Age of Onset  . Stroke Father   . Heart attack Sister   . Heart attack Brother   . Heart attack Brother   . Cancer Brother   . Cancer Brother   . Diabetes Brother   . Cancer Sister      ROS:  Please see the history of present illness.  All other ROS reviewed and negative.     Physical Exam/Data:   Vitals:   12/23/18 0245 12/23/18 0300 12/23/18 0337 12/23/18 0823  BP: (!) 164/47 (!) 179/45 (!) 172/66 (!) 182/60  Pulse: (!) 58 (!) 58 62 61  Resp: 14 14 18 18   Temp:   97.8 F (36.6 C) 98 F (36.7 C)  TempSrc:   Oral Oral  SpO2: 98% 100%  98%  Weight:   49 kg   Height:   5\' 4"  (1.626 m)     Intake/Output Summary (Last 24 hours)  at 12/23/2018 0846 Last data filed at 12/23/2018 0600 Gross per 24 hour  Intake 232.67 ml  Output -  Net 232.67 ml   Last 3 Weights 12/23/2018 12/22/2018 11/08/2018  Weight (lbs) 108 lb 1.6 oz 110 lb 109 lb  9.6 oz  Weight (kg) 49.034 kg 49.896 kg 49.714 kg     Body mass index is 18.56 kg/m.  General:  Well nourished, well developed, in no acute distress HEENT: normal Lymph: no adenopathy Neck: no JVD Endocrine:  No thryomegaly Vascular: No carotid bruits; FA pulses 2+ bilaterally without bruits  Cardiac:  normal S1, S2; RRR; no murmur  Lungs:  clear to auscultation bilaterally, no wheezing, rhonchi or rales  Abd: soft, nontender, no hepatomegaly  Ext: no edema Musculoskeletal:  No deformities, BUE and BLE strength normal and equal Skin: warm and dry  Neuro:  CNs 2-12 intact, no focal abnormalities noted Psych:  Normal affect   Telemetry:  Telemetry was personally reviewed and demonstrates:  NSR at rate of 60s  Relevant CV Studies: As summarized above  Laboratory Data:  Chemistry Recent Labs  Lab 12/23/18 0025  NA 134*  K 4.4  CL 104  CO2 24  GLUCOSE 104*  BUN 31*  CREATININE 1.04*  CALCIUM 9.1  GFRNONAA 47*  GFRAA 54*  ANIONGAP 6    Hematology Recent Labs  Lab 12/23/18 0025  WBC 6.7  RBC 3.24*  HGB 10.1*  HCT 32.1*  MCV 99.1  MCH 31.2  MCHC 31.5  RDW 13.1  PLT 191   Cardiac Enzymes Recent Labs  Lab 12/23/18 0302  TROPONINI <0.03    Recent Labs  Lab 12/23/18 0030  TROPIPOC 0.01    Radiology/Studies:  Dg Chest 2 View  Result Date: 12/23/2018 CLINICAL DATA:  85 year old female with chest pain radiating to her back since 2200 hours. EXAM: CHEST - 2 VIEW COMPARISON:  Chest radiographs 11/21/2006. lumbar MRI 05/16/2007. FINDINGS: Chronic large lung volumes. Prior CABG. Mediastinal contours are within normal limits. Calcified aortic atherosclerosis. Visualized tracheal air column is within normal limits. No pneumothorax, pulmonary edema, pleural  effusion or confluent pulmonary opacity. Osteopenia with partially visible compression fractures in the lower thoracic and upper lumbar spine which are new since 2008. Negative visible bowel gas pattern. IMPRESSION: 1. No acute cardiopulmonary abnormality. 2. Osteopenia with age indeterminate lower thoracic and upper lumbar compression fractures. 3.  Aortic Atherosclerosis (ICD10-I70.0). Electronically Signed   By: Genevie Ann M.D.   On: 12/23/2018 00:25   Assessment and Plan:   1. Chest pain with hx of CABG in 2007 - Normal stress test in 2015. She has hx of chronic pain with pain not resolving with nitro. Dr. Loletha Grayer recommended medical management on prior note.  - Presented here with chest, more intense than prior pain. Resolved with nitro x 3 this time. EKG without acute findings. Troponin negative. Chest pain free since admit. BP elevated.  -Continue ASA and BB. Will add Imdur 30mg  daily. This helps in with BP as well. Observe and ambulate in afternoon. If no recurrent pain, she can be discharged with outpatient follow up.   2. HTN - SBP elevated in 160-180s. Patient reports previously normal. On home dose of metoprolol 25mg  BID (unable to up titrate due to HR of 50-60s) and Losartan 100mg  qd. As above.  3. HLD - 12/23/2018: Cholesterol 116; HDL 48; LDL Cholesterol 61; Triglycerides 33; VLDL 7  - Excellent profile.   [facility] will sign off.   Medication Recommendations:  As above Other recommendations (labs, testing, etc):  None Follow up as an outpatient:  With Dr. Lurline Del team in 4 weeks   For questions or updates, please contact [facility] Please consult [email or web address] for contact info under     Signed, [employer],  PA  12/23/2018 8:46 AM

## 2018-12-23 NOTE — Plan of Care (Signed)
  Problem: Pain Managment: Goal: General experience of comfort will improve Outcome: Progressing   Problem: Safety: Goal: Ability to remain free from injury will improve Outcome: Progressing   

## 2019-01-14 ENCOUNTER — Telehealth: Payer: Self-pay | Admitting: Cardiovascular Disease

## 2019-01-14 NOTE — Telephone Encounter (Signed)
Pt's daughter aware of recommendations per Dr Sallyanne Kuster and will continue to monitor B/P will call if develops any problems./cy

## 2019-01-14 NOTE — Telephone Encounter (Signed)
Daughter of pt called.  Daughter would like to know what medication is best for the pt (her mom) to be taking to keep the Pt BP in a normal range.   The issue started on 03/22. The Pt went to [facility] after taking 3 Nitroglycerin pills Upon arrival , her BP was too high (over 190) and hospital kept the pt overnight. Pt was given Isosorbide and told to take 1 pill in the am Pt did that, and overnight when the medication would wear off, the BP would go back to elevated levels Went to PCP and the PCP prescribed Amlodipene to be taken 2x a day, and the pt started taking that at night.  Current Medication Routine: Isosorbide in AM, Amlodipene in PM ~1 hour post Isosorbide the pt bp goes back to normal until dinner , when it starts to get elevated again. Amlodipene is given at [facility]  Daughter wants to know what medications her Mom should be taking, meaning what the Hospital prescribed vs what her PCP prescribed, to keep her BP low, because it is still high in the morning.  Daughter is reluctant to give the pt both meds in the AM for fear of her BP going too low.

## 2019-01-14 NOTE — Telephone Encounter (Signed)
Agree with your advice. Those BP readings are all generally acceptable. Her DBP runs relatively low and I would not change the current Rx.

## 2019-01-14 NOTE — Telephone Encounter (Signed)
Spoke with pt's daughter re B/P and daughter has concerns re B/P getting  to low  during the day and or creeping  up during day  B/p this am was 125/53 and last night was 143/54 and another reading from this am was 160/66 this was before meds Informed daughter B/P were good. Informed daughter could have pt take Losartan mid day with remaining meds taken as before to see if would get better coverage mid day as daughter  feels B/p goes up later in day .Will forward to Dr Recardo Evangelist for review and recommendations ./cy

## 2019-01-15 ENCOUNTER — Other Ambulatory Visit: Payer: Self-pay | Admitting: Cardiovascular Disease

## 2019-01-15 MED ORDER — ISOSORBIDE MONONITRATE ER 30 MG PO TB24: 30.0000 mg | ORAL_TABLET | Freq: Every day | ORAL | 1 refills | Status: DC

## 2019-01-15 NOTE — Telephone Encounter (Signed)
New Message    *STAT* If patient is at the pharmacy, call can be transferred to refill team.   1. Which medications need to be refilled? (please list name of each medication and dose if known) Isosorbide 30mg    2. Which pharmacy/location (including street and city if local pharmacy) is medication to be sent to? [place]   3. Do they need a 30 day or 90 day supply? 90 day supply

## 2019-01-15 NOTE — Telephone Encounter (Signed)
Isosorbide refilled. 

## 2019-01-21 ENCOUNTER — Telehealth: Payer: Self-pay

## 2019-01-21 NOTE — Telephone Encounter (Signed)

## 2019-01-21 NOTE — Telephone Encounter (Signed)
Virtual Visit Pre-Appointment Phone Call  "[name], I am calling you today to discuss your upcoming appointment. We are currently trying to limit exposure to the virus that causes COVID-19 by seeing patients at home rather than in the office."  1. "What is the BEST phone number to call the day of the visit?" - include this in appointment notes  2. "Do you have or have access to (through a family member/friend) a smartphone with video capability that we can use for your visit?" a. If yes - list this number in appt notes as "cell" (if different from BEST phone #) and list the appointment type as a VIDEO visit in appointment notes b. If no - list the appointment type as a PHONE visit in appointment notes  3. Confirm consent - "In the setting of the current Covid19 crisis, you are scheduled for a PHONE visit with your provider on 01/27/2019 at 11:40AM.  Just as we do with many in-office visits, in order for you to participate in this visit, we must obtain consent.  If you'd like, I can send this to your mychart (if signed up) or email for you to review.  Otherwise, I can obtain your verbal consent now.  All virtual visits are billed to your insurance company just like a normal visit would be.  By agreeing to a virtual visit, we'd like you to understand that the technology does not allow for your provider to perform an examination, and thus may limit your provider's ability to fully assess your condition. If your provider identifies any concerns that need to be evaluated in person, we will make arrangements to do so.  Finally, though the technology is pretty good, we cannot assure that it will always work on either your or our end, and in the setting of a video visit, we may have to convert it to a phone-only visit.  In either situation, we cannot ensure that we have a secure connection.  Are you willing to proceed?" STAFF: Did the patient verbally acknowledge consent to telehealth visit? Document YES/NO  here: YES  4. Advise patient to be prepared - "Two hours prior to your appointment, go ahead and check your blood pressure, pulse, oxygen saturation, and your weight (if you have the equipment to check those) and write them all down. When your visit starts, your provider will ask you for this information. If you have an Apple Watch or Kardia device, please plan to have heart rate information ready on the day of your appointment. Please have a pen and paper handy nearby the day of the visit as well."  5. Give patient instructions for MyChart download to smartphone OR Doximity/Doxy.me as below if video visit (depending on what platform provider is using)  6. Inform patient they will receive a phone call 15 minutes prior to their appointment time (may be from unknown caller ID) so they should be prepared to answer    TELEPHONE CALL NOTE  Leather L Caslin has been deemed a candidate for a follow-up tele-health visit to limit community exposure during the Covid-19 pandemic. I spoke with the patient via phone to ensure availability of phone/video source, confirm preferred email & phone number, and discuss instructions and expectations.  I reminded Rayli L Bost to be prepared with any vital sign and/or heart rhythm information that could potentially be obtained via home monitoring, at the time of her visit. I reminded Barbar L Cassatt to expect a phone call prior to her  visit.  Harold Hedge, CMA 01/21/2019 3:16 PM   INSTRUCTIONS FOR DOWNLOADING THE MYCHART APP TO SMARTPHONE  - The patient must first make sure to have activated MyChart and know their login information - If Apple, go to [employer] and type in MyChart in the search bar and download the app. If Android, ask patient to go to [employer] and type in [place] in the search bar and download the app. The app is free but as with any other app downloads, their phone may require them to verify saved payment information or Apple/Android  password.  - The patient will need to then log into the app with their MyChart username and password, and select [facility] as their healthcare provider to link the account. When it is time for your visit, go to the MyChart app, find appointments, and click Begin Video Visit. Be sure to Select Allow for your device to access the Microphone and Camera for your visit. You will then be connected, and your provider will be with you shortly.  **If they have any issues connecting, or need assistance please contact MyChart service desk [phone])**  **If using a computer, in order to ensure the best quality for their visit they will need to use either of the following Internet Browsers: [employer], or Google Chrome**  IF USING DOXIMITY or DOXY.ME - The patient will receive a link just prior to their visit by text.     FULL LENGTH CONSENT FOR TELE-HEALTH VISIT   I hereby voluntarily request, consent and authorize [facility] and its employed or contracted physicians, physician assistants, nurse practitioners or other licensed health care professionals (the Practitioner), to provide me with telemedicine health care services (the "Services") as deemed necessary by the treating Practitioner. I acknowledge and consent to receive the Services by the Practitioner via telemedicine. I understand that the telemedicine visit will involve communicating with the Practitioner through live audiovisual communication technology and the disclosure of certain medical information by electronic transmission. I acknowledge that I have been given the opportunity to request an in-person assessment or other available alternative prior to the telemedicine visit and am voluntarily participating in the telemedicine visit.  I understand that I have the right to withhold or withdraw my consent to the use of telemedicine in the course of my care at any time, without affecting my right to future care or treatment,  and that the Practitioner or I may terminate the telemedicine visit at any time. I understand that I have the right to inspect all information obtained and/or recorded in the course of the telemedicine visit and may receive copies of available information for a reasonable fee.  I understand that some of the potential risks of receiving the Services via telemedicine include:  Marland Kitchen Delay or interruption in medical evaluation due to technological equipment failure or disruption; . Information transmitted may not be sufficient (e.g. poor resolution of images) to allow for appropriate medical decision making by the Practitioner; and/or  . In rare instances, security protocols could fail, causing a breach of personal health information.  Furthermore, I acknowledge that it is my responsibility to provide information about my medical history, conditions and care that is complete and accurate to the best of my ability. I acknowledge that Practitioner's advice, recommendations, and/or decision may be based on factors not within their control, such as incomplete or inaccurate data provided by me or distortions of diagnostic images or specimens that may result from electronic transmissions. I  understand that the practice of medicine is not an exact science and that Practitioner makes no warranties or guarantees regarding treatment outcomes. I acknowledge that I will receive a copy of this consent concurrently upon execution via email to the email address I last provided but may also request a printed copy by calling the office of [facility].    I understand that my insurance will be billed for this visit.   I have read or had this consent read to me. . I understand the contents of this consent, which adequately explains the benefits and risks of the Services being provided via telemedicine.  . I have been provided ample opportunity to ask questions regarding this consent and the Services and have had my questions  answered to my satisfaction. . I give my informed consent for the services to be provided through the use of telemedicine in my medical care  By participating in this telemedicine visit I agree to the above.

## 2019-01-27 ENCOUNTER — Encounter: Payer: Self-pay | Admitting: Cardiovascular Disease

## 2019-01-27 ENCOUNTER — Telehealth: Payer: Self-pay | Admitting: Cardiovascular Disease

## 2019-01-27 ENCOUNTER — Telehealth (INDEPENDENT_AMBULATORY_CARE_PROVIDER_SITE_OTHER): Payer: Medicare Other | Admitting: Cardiovascular Disease

## 2019-01-27 DIAGNOSIS — I10 Essential (primary) hypertension: Secondary | ICD-10-CM

## 2019-01-27 DIAGNOSIS — E7800 Pure hypercholesterolemia, unspecified: Secondary | ICD-10-CM | POA: Diagnosis not present

## 2019-01-27 DIAGNOSIS — I25708 Atherosclerosis of coronary artery bypass graft(s), unspecified, with other forms of angina pectoris: Secondary | ICD-10-CM | POA: Diagnosis not present

## 2019-01-27 MED ORDER — ISOSORBIDE MONONITRATE ER 30 MG PO TB24: 15.0000 mg | ORAL_TABLET | ORAL | 1 refills | Status: DC

## 2019-01-27 MED ORDER — LOSARTAN POTASSIUM 50 MG PO TABS: 50.0000 mg | ORAL_TABLET | Freq: Every evening | ORAL | 1 refills | Status: DC

## 2019-01-27 NOTE — Patient Instructions (Signed)
Medication Instructions:   Continue Amlodipine--take in the evening  Decrease Losartan to 50 mg daily in the evening.  Decrease Isosorbide to 15 mg (1/2 tab) daily in the morning.  Make sure you are taking your metoprolol as close as possible to 12 hours apart.  If you need a refill on your cardiac medications before your next appointment, please call your pharmacy.    Follow-Up: At [facility], you and your health needs are our priority.  As part of our continuing mission to provide you with exceptional heart care, we have created designated Provider Care Teams.  These Care Teams include your primary Cardiologist (physician) and Advanced Practice Providers (APPs -  Physician Assistants and Nurse Practitioners) who all work together to provide you with the care you need, when you need it. You will need a follow up appointment in 6 months.  Please call our office 2 months in advance to schedule this appointment.  You may see Sanda Klein, MD or one of the following Advanced Practice Providers on your designated Care Team: [place], [place] . Fabian Sharp, PA-C  Any Other Special Instructions Will Be Listed Below (If Applicable). Please sign up for MyChart (link sent to daughter [phone]) and send BP readings to Dr. Sallyanne Kuster on Friday 01/31/19.

## 2019-01-27 NOTE — Telephone Encounter (Signed)
New Message   Pt c/o medication issue:  1. Name of Medication: isosorbide mononitrate (IMDUR) 30 MG 24 hr tablet   and losartan (COZAAR) 50 MG tablet   2. How are you currently taking this medication (dosage and times per day)?   3. Are you having a reaction (difficulty breathing--STAT)?   4. What is your medication issue? Pharmacy is calling to confirm the patient dosage changes on the above mentioned medication. Please call.

## 2019-01-27 NOTE — Progress Notes (Signed)
Virtual Visit via Video Note   This visit type was conducted due to national recommendations for restrictions regarding the COVID-19 Pandemic (e.g. social distancing) in an effort to limit this patient's exposure and mitigate transmission in our community.  Due to her co-morbid illnesses, this patient is at least at moderate risk for complications without adequate follow up.  This format is felt to be most appropriate for this patient at this time.  All issues noted in this document were discussed and addressed.  A limited physical exam was performed with this format.  Please refer to the patient's chart for her consent to telehealth for [facility].   Video connection was poor and could only be maintained for part of the visit; her daughter [name] was there for the whole visit  Evaluation Performed:  Follow-up visit  Date:  01/27/2019   ID:  Alison, [name] [birth year], MRN [id number]  Patient Location: Home Provider Location: Home  PCP:  Celene Squibb, MD  Cardiologist:  Sanda Klein, MD  Electrophysiologist:  None   Chief Complaint: Angina pectoris, variable blood pressure  History of Present Illness:    [name] is a 85 y.o. female with coronary artery disease and previous bypass surgery, recently hospitalized with complaints of chest discomfort in March 2020, when her blood pressure was consistently elevated.  In view of the patient's age and personal preference, conservative management was recommended, amlodipine and isosorbide were added as antianginal medications.  Since that time her blood pressure has been quite variable ranging from 103/40 to 170/60.  She has not had any further complaints of chest pain.  She denies headaches or other neurological complaints when her blood pressure is high.  She felt weak and had some visual disturbances when her diastolic blood pressure was in the 40s.  She has had at most trivial ankle swelling.  She denies any dyspnea at rest or with  the very limited activity that she performs.  Her blood pressure tends to be higher in the mornings and drops precipitously sometimes after she takes her isosorbide.  The patient does not have symptoms concerning for COVID-19 infection (fever, chills, cough, or new shortness of breath).    Past Medical History:  Diagnosis Date  . CAD (coronary artery disease)    CABG 10/28/2005 LIMA to LAD,SVG to OM,SVG to distal RCA  . Cancer (HCC)    vaginal  . Carotid atherosclerosis    minor  . Coronary artery disease   . Hyperlipidemia   . Hypertension   . Systemic hypertension    Past Surgical History:  Procedure Laterality Date  . ABDOMINAL HYSTERECTOMY    . ABDOMINAL HYSTERECTOMY  1966  . CARDIAC CATHETERIZATION  10/27/2005   left main and severe 2 vessel disease.  Marland Kitchen CATARACT EXTRACTION  1998 & 2004  . CESAREAN SECTION    . CHOLECYSTECTOMY    . CORONARY ARTERY BYPASS GRAFT    . CORONARY ARTERY BYPASS GRAFT  10/28/2005   LIMA to LAD,SVG to OM,SVG to distal RCA  . NM MYOCAR PERF WALL MOTION  01/11/2010   normal  . US ECHOCARDIOGRAPHY  08/01/2011   mild to mod TR, ca+ AOV leaflets, AOV mildly sclerotic     Current Meds  Medication Sig  . amLODipine (NORVASC) 5 MG tablet Take 5 mg by mouth daily.  Marland Kitchen aspirin 81 MG chewable tablet Chew 81 mg by mouth at bedtime.  Marland Kitchen DEXILANT 60 MG capsule Take 60 mg by mouth daily.   Marland Kitchen  isosorbide mononitrate (IMDUR) 30 MG 24 hr tablet Take 0.5 tablets (15 mg total) by mouth every morning.  Marland Kitchen levothyroxine (SYNTHROID, LEVOTHROID) 50 MCG tablet Take 50 mcg by mouth daily before breakfast.   . losartan (COZAAR) 50 MG tablet Take 1 tablet (50 mg total) by mouth every evening.  . metoprolol tartrate (LOPRESSOR) 25 MG tablet TAKE ONE TABLET BY MOUTH TWICE A DAY (Patient taking differently: Take 25 mg by mouth 2 (two) times daily. )  . Multiple Vitamin (MULTIVITAMIN WITH MINERALS) TABS tablet Take 1 tablet by mouth daily.  . Multiple Vitamins-Minerals  (PRESERVISION AREDS 2 PO) Take 1 tablet by mouth 2 (two) times daily.  . nitroGLYCERIN (NITROSTAT) 0.4 MG SL tablet Place 1 tablet (0.4 mg total) under the tongue every 5 (five) minutes as needed. (Patient taking differently: Place 0.4 mg under the tongue every 5 (five) minutes as needed for chest pain. )  . [DISCONTINUED] isosorbide mononitrate (IMDUR) 30 MG 24 hr tablet Take 1 tablet (30 mg total) by mouth daily.  . [DISCONTINUED] losartan (COZAAR) 100 MG tablet Take 100 mg by mouth daily.     Allergies:   Lipitor [atorvastatin]; Aspirin; Celecoxib; Levaquin [levofloxacin in d5w]; Sulfa antibiotics; and Sulfonamide derivatives   Social History   Tobacco Use  . Smoking status: Never Smoker  . Smokeless tobacco: Never Used  Substance Use Topics  . Alcohol use: No  . Drug use: No     Family Hx: The patient's family history includes Cancer in her brother, brother, and sister; Diabetes in her brother; Heart attack in her brother, brother, and sister; Stroke in her father.  ROS:   Please see the history of present illness.     All other systems reviewed and are negative.   Prior CV studies:   The following studies were reviewed today:  Notes from hospitalization in March 2020  Labs/Other Tests and Data Reviewed:    EKG:  An ECG dated 3/22.2020 was personally reviewed today and demonstrated:  Sinus rhythm, right bundle branch block, no acute ischemic changes  Recent Labs: 12/23/2018: BUN 31; Creatinine, Ser 1.04; Hemoglobin 10.1; Platelets 191; Potassium 4.4; Sodium 134; TSH 1.769   Recent Lipid Panel Lab Results  Component Value Date/Time   CHOL 116 12/23/2018 03:02 AM   TRIG 33 12/23/2018 03:02 AM   HDL 48 12/23/2018 03:02 AM   CHOLHDL 2.4 12/23/2018 03:02 AM   LDLCALC 61 12/23/2018 03:02 AM    Wt Readings from Last 3 Encounters:  01/27/19 111 lb (50.3 kg)  12/23/18 108 lb 1.6 oz (49 kg)  11/08/18 109 lb 9.6 oz (49.7 kg)     Objective:    Vital Signs:  BP (!)  115/52   Pulse 61   Ht 5\' 4"  (1.626 m)   Wt 111 lb (50.3 kg)   BMI 19.05 kg/m    VITAL SIGNS:  reviewed GEN:  no acute distress RESPIRATORY:  normal respiratory effort, symmetric expansion CARDIOVASCULAR:  no peripheral edema NEURO:  alert and oriented x 3, no obvious focal deficit PSYCH:  normal affect  ASSESSMENT & PLAN:    1. CAD: Angina is now well controlled.  We will try to cut back on the dose of isosorbide to only 15 mg daily. 2. HTN: She has a very wild pulse pressure and tends to run very low diastolic values.  We will reduce the losartan to 50 mg once daily and take it in the evening.  Try to concentrate on using medications that also have  antianginal benefit.  I think she is more likely to be harmed by low blood pressure rather than high blood pressure. 3. HLP: Excellent lipid profile.  We will have her call back with some blood pressure recordings later this week.  COVID-19 Education: The signs and symptoms of COVID-19 were discussed with the patient and how to seek care for testing (follow up with PCP or arrange E-visit).  The importance of social distancing was discussed today.  Time:   Today, I have spent 17 minutes with the patient with telehealth technology discussing the above problems.     Medication Adjustments/Labs and Tests Ordered: Current medicines are reviewed at length with the patient today.  Concerns regarding medicines are outlined above.   Tests Ordered: No orders of the defined types were placed in this encounter.   Medication Changes: Meds ordered this encounter  Medications  . isosorbide mononitrate (IMDUR) 30 MG 24 hr tablet    Sig: Take 0.5 tablets (15 mg total) by mouth every morning.    Dispense:  45 tablet    Refill:  1  . losartan (COZAAR) 50 MG tablet    Sig: Take 1 tablet (50 mg total) by mouth every evening.    Dispense:  90 tablet    Refill:  1    Disposition:  Follow up 6 months  Signed, Sanda Klein, MD  01/27/2019  12:22 PM    [facility]

## 2019-01-27 NOTE — Telephone Encounter (Signed)
Spoke with pharmacist and advised Losartan and Isosorbide both reduced at visit, confirmed doses

## 2019-01-30 ENCOUNTER — Ambulatory Visit: Payer: Medicare Other | Admitting: Cardiology

## 2019-02-08 ENCOUNTER — Other Ambulatory Visit: Payer: Self-pay | Admitting: Cardiovascular Disease

## 2019-03-17 DIAGNOSIS — H353112 Nonexudative age-related macular degeneration, right eye, intermediate dry stage: Secondary | ICD-10-CM | POA: Diagnosis not present

## 2019-03-17 DIAGNOSIS — H353221 Exudative age-related macular degeneration, left eye, with active choroidal neovascularization: Secondary | ICD-10-CM | POA: Diagnosis not present

## 2019-04-21 ENCOUNTER — Other Ambulatory Visit: Payer: Self-pay

## 2019-04-21 DIAGNOSIS — Z961 Presence of intraocular lens: Secondary | ICD-10-CM | POA: Diagnosis not present

## 2019-04-21 DIAGNOSIS — H353111 Nonexudative age-related macular degeneration, right eye, early dry stage: Secondary | ICD-10-CM | POA: Diagnosis not present

## 2019-04-21 DIAGNOSIS — H353221 Exudative age-related macular degeneration, left eye, with active choroidal neovascularization: Secondary | ICD-10-CM | POA: Diagnosis not present

## 2019-04-21 NOTE — Patient Outreach (Signed)
[facility]) Care Management  04/21/2019  [name] [birth year] [id number]   Medication Adherence call to [name] HIPPA Compliant Voice message left with a call back number. [name] is showing past due on Losartan 10 mg under [facility].   [facility] Management Direct Dial [phone]  Fax [phone] [email or web address]

## 2019-05-26 DIAGNOSIS — H353221 Exudative age-related macular degeneration, left eye, with active choroidal neovascularization: Secondary | ICD-10-CM | POA: Diagnosis not present

## 2019-05-26 DIAGNOSIS — H353112 Nonexudative age-related macular degeneration, right eye, intermediate dry stage: Secondary | ICD-10-CM | POA: Diagnosis not present

## 2019-06-03 DIAGNOSIS — L820 Inflamed seborrheic keratosis: Secondary | ICD-10-CM | POA: Diagnosis not present

## 2019-06-03 DIAGNOSIS — L308 Other specified dermatitis: Secondary | ICD-10-CM | POA: Diagnosis not present

## 2019-06-03 DIAGNOSIS — L280 Lichen simplex chronicus: Secondary | ICD-10-CM | POA: Diagnosis not present

## 2019-06-30 DIAGNOSIS — H353111 Nonexudative age-related macular degeneration, right eye, early dry stage: Secondary | ICD-10-CM | POA: Diagnosis not present

## 2019-06-30 DIAGNOSIS — H353221 Exudative age-related macular degeneration, left eye, with active choroidal neovascularization: Secondary | ICD-10-CM | POA: Diagnosis not present

## 2019-07-14 DIAGNOSIS — D649 Anemia, unspecified: Secondary | ICD-10-CM | POA: Diagnosis not present

## 2019-07-14 DIAGNOSIS — E039 Hypothyroidism, unspecified: Secondary | ICD-10-CM | POA: Diagnosis not present

## 2019-07-14 DIAGNOSIS — E782 Mixed hyperlipidemia: Secondary | ICD-10-CM | POA: Diagnosis not present

## 2019-07-14 DIAGNOSIS — I10 Essential (primary) hypertension: Secondary | ICD-10-CM | POA: Diagnosis not present

## 2019-07-14 DIAGNOSIS — R7301 Impaired fasting glucose: Secondary | ICD-10-CM | POA: Diagnosis not present

## 2019-07-17 DIAGNOSIS — H353131 Nonexudative age-related macular degeneration, bilateral, early dry stage: Secondary | ICD-10-CM | POA: Diagnosis not present

## 2019-07-17 DIAGNOSIS — I2510 Atherosclerotic heart disease of native coronary artery without angina pectoris: Secondary | ICD-10-CM | POA: Diagnosis not present

## 2019-07-17 DIAGNOSIS — E782 Mixed hyperlipidemia: Secondary | ICD-10-CM | POA: Diagnosis not present

## 2019-07-17 DIAGNOSIS — I10 Essential (primary) hypertension: Secondary | ICD-10-CM | POA: Diagnosis not present

## 2019-07-17 DIAGNOSIS — E039 Hypothyroidism, unspecified: Secondary | ICD-10-CM | POA: Diagnosis not present

## 2019-07-28 DIAGNOSIS — Z961 Presence of intraocular lens: Secondary | ICD-10-CM | POA: Diagnosis not present

## 2019-07-28 DIAGNOSIS — H353112 Nonexudative age-related macular degeneration, right eye, intermediate dry stage: Secondary | ICD-10-CM | POA: Diagnosis not present

## 2019-07-28 DIAGNOSIS — H353111 Nonexudative age-related macular degeneration, right eye, early dry stage: Secondary | ICD-10-CM | POA: Diagnosis not present

## 2019-07-28 DIAGNOSIS — H353221 Exudative age-related macular degeneration, left eye, with active choroidal neovascularization: Secondary | ICD-10-CM | POA: Diagnosis not present

## 2019-08-19 DIAGNOSIS — Z23 Encounter for immunization: Secondary | ICD-10-CM | POA: Diagnosis not present

## 2019-09-04 DIAGNOSIS — H02032 Senile entropion of right lower eyelid: Secondary | ICD-10-CM | POA: Diagnosis not present

## 2019-09-04 DIAGNOSIS — H353112 Nonexudative age-related macular degeneration, right eye, intermediate dry stage: Secondary | ICD-10-CM | POA: Diagnosis not present

## 2019-09-04 DIAGNOSIS — H353221 Exudative age-related macular degeneration, left eye, with active choroidal neovascularization: Secondary | ICD-10-CM | POA: Diagnosis not present

## 2019-09-10 DIAGNOSIS — H353221 Exudative age-related macular degeneration, left eye, with active choroidal neovascularization: Secondary | ICD-10-CM | POA: Diagnosis not present

## 2019-09-10 DIAGNOSIS — I10 Essential (primary) hypertension: Secondary | ICD-10-CM | POA: Diagnosis not present

## 2019-09-10 DIAGNOSIS — I2510 Atherosclerotic heart disease of native coronary artery without angina pectoris: Secondary | ICD-10-CM | POA: Diagnosis not present

## 2019-09-10 DIAGNOSIS — H353131 Nonexudative age-related macular degeneration, bilateral, early dry stage: Secondary | ICD-10-CM | POA: Diagnosis not present

## 2019-09-10 DIAGNOSIS — E782 Mixed hyperlipidemia: Secondary | ICD-10-CM | POA: Diagnosis not present

## 2019-09-22 ENCOUNTER — Other Ambulatory Visit: Payer: Self-pay

## 2019-09-22 MED ORDER — METOPROLOL TARTRATE 25 MG PO TABS: 25.0000 mg | ORAL_TABLET | Freq: Two times a day (BID) | ORAL | 1 refills | Status: DC

## 2019-09-22 MED ORDER — LOSARTAN POTASSIUM 50 MG PO TABS: 50.0000 mg | ORAL_TABLET | Freq: Every evening | ORAL | 1 refills | Status: DC

## 2019-09-22 MED ORDER — ISOSORBIDE MONONITRATE ER 30 MG PO TB24: ORAL_TABLET | ORAL | 3 refills | Status: DC

## 2019-10-06 DIAGNOSIS — H353131 Nonexudative age-related macular degeneration, bilateral, early dry stage: Secondary | ICD-10-CM | POA: Diagnosis not present

## 2019-10-06 DIAGNOSIS — E7849 Other hyperlipidemia: Secondary | ICD-10-CM | POA: Diagnosis not present

## 2019-10-06 DIAGNOSIS — I2510 Atherosclerotic heart disease of native coronary artery without angina pectoris: Secondary | ICD-10-CM | POA: Diagnosis not present

## 2019-10-06 DIAGNOSIS — I10 Essential (primary) hypertension: Secondary | ICD-10-CM | POA: Diagnosis not present

## 2019-11-04 DIAGNOSIS — H353131 Nonexudative age-related macular degeneration, bilateral, early dry stage: Secondary | ICD-10-CM | POA: Diagnosis not present

## 2019-11-04 DIAGNOSIS — I2510 Atherosclerotic heart disease of native coronary artery without angina pectoris: Secondary | ICD-10-CM | POA: Diagnosis not present

## 2019-11-04 DIAGNOSIS — E7849 Other hyperlipidemia: Secondary | ICD-10-CM | POA: Diagnosis not present

## 2019-11-04 DIAGNOSIS — I10 Essential (primary) hypertension: Secondary | ICD-10-CM | POA: Diagnosis not present

## 2019-11-10 DIAGNOSIS — H353221 Exudative age-related macular degeneration, left eye, with active choroidal neovascularization: Secondary | ICD-10-CM | POA: Diagnosis not present

## 2019-11-10 DIAGNOSIS — H353111 Nonexudative age-related macular degeneration, right eye, early dry stage: Secondary | ICD-10-CM | POA: Diagnosis not present

## 2019-11-25 DIAGNOSIS — L280 Lichen simplex chronicus: Secondary | ICD-10-CM | POA: Diagnosis not present

## 2019-11-25 DIAGNOSIS — L299 Pruritus, unspecified: Secondary | ICD-10-CM | POA: Diagnosis not present

## 2019-12-03 ENCOUNTER — Encounter: Payer: Self-pay | Admitting: Cardiovascular Disease

## 2019-12-03 ENCOUNTER — Ambulatory Visit: Payer: Medicare Other | Admitting: Cardiovascular Disease

## 2019-12-03 ENCOUNTER — Other Ambulatory Visit: Payer: Self-pay

## 2019-12-03 VITALS — BP 148/64 | HR 57 | Ht 64.0 in | Wt 110.0 lb

## 2019-12-03 DIAGNOSIS — I25118 Atherosclerotic heart disease of native coronary artery with other forms of angina pectoris: Secondary | ICD-10-CM | POA: Diagnosis not present

## 2019-12-03 DIAGNOSIS — E7800 Pure hypercholesterolemia, unspecified: Secondary | ICD-10-CM | POA: Diagnosis not present

## 2019-12-03 DIAGNOSIS — I10 Essential (primary) hypertension: Secondary | ICD-10-CM | POA: Diagnosis not present

## 2019-12-03 MED ORDER — NITROGLYCERIN 0.4 MG SL SUBL: 0.4000 mg | SUBLINGUAL_TABLET | SUBLINGUAL | 3 refills | Status: DC | PRN

## 2019-12-03 NOTE — Progress Notes (Signed)
Cardiology Office Note    Date:  12/03/2019   ID:  [name], DOB 28-Jul-1925, MRN [id number]  PCP:  Celene Squibb, MD  Cardiologist:   Sanda Klein, MD   Chief Complaint  Patient presents with  . Coronary Artery Disease    History of Present Illness:  [name] is a 86 y.o. female with history of coronary artery disease, now roughly 13 years following bypass surgery (CABG 10/28/2005 LIMA to LAD,SVG to OM,SVG to distal RCA). She has not had new coronary events since then.   She had one episode of chest discomfort when she walked outside to the mailbox and cold weather.  This happened about 10 days ago.  She needed to take 2 sublingual nitroglycerin to alleviate the symptoms, but believes that her nitroglycerin tablets were outdated.  She did not get a headache from them.  This is the first time she is taken nitroglycerin in over a month.  Otherwise she is doing well.  She takes the furosemide every day to prevent ankle edema.  The patient specifically denies any chest pain at rest, dyspnea at rest or with exertion, orthopnea, paroxysmal nocturnal dyspnea, syncope, palpitations, focal neurological deficits, intermittent claudication, lower extremity edema, unexplained weight gain, cough, hemoptysis or wheezing.  Her systolic blood pressure is a little high today, and this is the case with her home readings as well, but her diastolic blood pressure is often in the 50s.  Her most recent lipid profile showed an LDL well within target range and an excellent HDL at 65 (total cholesterol 153, HDL 65, triglycerides 82).  She remains very lean, borderline underweight with a BMI just under 19.  Past Medical History:  Diagnosis Date  . CAD (coronary artery disease)    CABG 10/28/2005 LIMA to LAD,SVG to OM,SVG to distal RCA  . Cancer (HCC)    vaginal  . Carotid atherosclerosis    minor  . Coronary artery disease   . Hyperlipidemia   . Hypertension   . Systemic hypertension     Past  Surgical History:  Procedure Laterality Date  . ABDOMINAL HYSTERECTOMY    . ABDOMINAL HYSTERECTOMY  1966  . CARDIAC CATHETERIZATION  10/27/2005   left main and severe 2 vessel disease.  Marland Kitchen CATARACT EXTRACTION  1998 & 2004  . CESAREAN SECTION    . CHOLECYSTECTOMY    . CORONARY ARTERY BYPASS GRAFT    . CORONARY ARTERY BYPASS GRAFT  10/28/2005   LIMA to LAD,SVG to OM,SVG to distal RCA  . NM MYOCAR PERF WALL MOTION  01/11/2010   normal  . US ECHOCARDIOGRAPHY  08/01/2011   mild to mod TR, ca+ AOV leaflets, AOV mildly sclerotic    Current Medications: Outpatient Medications Prior to Visit  Medication Sig Dispense Refill  . acetaminophen (TYLENOL) 650 MG CR tablet Take 650 mg by mouth at bedtime. Arthritis Pain    . aspirin 81 MG chewable tablet Chew 81 mg by mouth at bedtime.    Marland Kitchen DEXILANT 60 MG capsule Take 60 mg by mouth daily.     . Homeopathic Products (LEG CRAMPS PO) Take 2 capsules by mouth at bedtime.    . isosorbide mononitrate (IMDUR) 30 MG 24 hr tablet TAKE ONE (1) TABLET BY MOUTH EVERY DAY (Patient taking differently: Take 15 mg by mouth daily. ) 30 tablet 3  . levothyroxine (SYNTHROID, LEVOTHROID) 50 MCG tablet Take 50 mcg by mouth daily before breakfast.     . losartan (COZAAR) 100 MG  tablet Take 50 mg by mouth daily.    Marland Kitchen lovastatin (MEVACOR) 20 MG tablet Take 20 mg by mouth at bedtime.    . metoprolol tartrate (LOPRESSOR) 25 MG tablet Take 1 tablet (25 mg total) by mouth 2 (two) times daily. Take medication as close as possible to 12 hours apart. 180 tablet 1  . Multiple Vitamin (MULTIVITAMIN WITH MINERALS) TABS tablet Take 1 tablet by mouth daily.    . Multiple Vitamins-Minerals (PRESERVISION AREDS 2 PO) Take 1 tablet by mouth 2 (two) times daily.    . nitroGLYCERIN (NITROSTAT) 0.4 MG SL tablet Place 1 tablet (0.4 mg total) under the tongue every 5 (five) minutes as needed. (Patient taking differently: Place 0.4 mg under the tongue every 5 (five) minutes as needed for chest  pain. ) 25 tablet 3  . potassium chloride (KLOR-CON) 10 MEQ tablet Take 10 mEq by mouth daily. With lasix dose    . losartan (COZAAR) 50 MG tablet Take 1 tablet (50 mg total) by mouth every evening. 90 tablet 1  . amLODipine (NORVASC) 5 MG tablet Take 5 mg by mouth daily.     No facility-administered medications prior to visit.     Allergies:   Lipitor [atorvastatin], Aspirin, Celecoxib, Levaquin [levofloxacin in d5w], Sulfa antibiotics, and Sulfonamide derivatives   Social History   Socioeconomic History  . Marital status: Widowed    Spouse name: Not on file  . Number of children: Not on file  . Years of education: Not on file  . Highest education level: Not on file  Occupational History  . Not on file  Tobacco Use  . Smoking status: Never Smoker  . Smokeless tobacco: Never Used  Substance and Sexual Activity  . Alcohol use: No  . Drug use: No  . Sexual activity: Not Currently  Other Topics Concern  . Not on file  Social History Narrative   ** Merged History Encounter **       Social Determinants of Health   Financial Resource Strain:   . Difficulty of Paying Living Expenses: Not on file  Food Insecurity:   . Worried About Charity fundraiser in the Last Year: Not on file  . Ran Out of Food in the Last Year: Not on file  Transportation Needs:   . Lack of Transportation (Medical): Not on file  . Lack of Transportation (Non-Medical): Not on file  Physical Activity:   . Days of Exercise per Week: Not on file  . Minutes of Exercise per Session: Not on file  Stress:   . Feeling of Stress : Not on file  Social Connections:   . Frequency of Communication with Friends and Family: Not on file  . Frequency of Social Gatherings with Friends and Family: Not on file  . Attends Religious Services: Not on file  . Active Member of Clubs or Organizations: Not on file  . Attends Archivist Meetings: Not on file  . Marital Status: Not on file     Family History:  The  patient's family history includes Cancer in her brother, brother, and sister; Diabetes in her brother; Heart attack in her brother, brother, and sister; Stroke in her father.   ROS:   Please see the history of present illness.    ROS All other systems are reviewed and are negative.   PHYSICAL EXAM:   VS:  BP (!) 148/64   Pulse (!) 57   Ht 5\' 4"  (1.626 m)   Wt 110 lb (  49.9 kg)   BMI 18.88 kg/m      General: Alert, oriented x3, no distress, very lean Head: no evidence of trauma, PERRL, EOMI, no exophtalmos or lid lag, no myxedema, no xanthelasma; normal ears, nose and oropharynx Neck: normal jugular venous pulsations and no hepatojugular reflux; brisk carotid pulses without delay and no carotid bruits Chest: clear to auscultation, no signs of consolidation by percussion or palpation, normal fremitus, symmetrical and full respiratory excursions Cardiovascular: normal position and quality of the apical impulse, regular rhythm, normal first and widely split second heart sounds, no murmurs, rubs or gallops Abdomen: no tenderness or distention, no masses by palpation, no abnormal pulsatility or arterial bruits, normal bowel sounds, no hepatosplenomegaly Extremities: no clubbing, cyanosis or edema; 2+ radial, ulnar and brachial pulses bilaterally; 2+ right femoral, posterior tibial and dorsalis pedis pulses; 2+ left femoral, posterior tibial and dorsalis pedis pulses; no subclavian or femoral bruits Neurological: grossly nonfocal Psych: Normal mood and affect   Wt Readings from Last 3 Encounters:  12/03/19 110 lb (49.9 kg)  01/27/19 111 lb (50.3 kg)  12/23/18 108 lb 1.6 oz (49 kg)      Studies/Labs Reviewed:   EKG:  EKG is ordered today.  It has not changed from previous tracings and shows normal sinus bradycardia, right bundle branch block, mild ST segment depression and T wave inversion in leads V2-V4.  QTc 496 ms  Recent Labs: 07/14/2019 Cholesterol 153, HDL 55, LDL 61,  triglycerides 82 Hemoglobin A1c 5.7%, hemoglobin 11.8, creatinine 0.88, normal liver function tests    ASSESSMENT:    1. Coronary artery disease of native artery of native heart with stable angina pectoris ([place])   2. Essential hypertension   3. Pure hypercholesterolemia      PLAN:  In order of problems listed above:  1. CAD: Only one episode of exertional angina within the last several months. Normal nuclear stress test in November 2015.  She is 86 years old and would prefer noninvasive management.  She is only taking half of the low-dose isosorbide mononitrate).  We can increase this further.  Again reviewed the difference stable and unstable angina pectoris and when she should seek emergency medical attention (has pain at rest not relieved by 3 consecutive sublingual nitroglycerin or lasting more than 20-30 minutes). 2. HTN: Adequate control.  Avoid excessive reduction in systolic blood pressure due to her low diastolic pressure.  We had to reduce the doses of her losartan and isosorbide at her last office visit in April 2020. 3. HLP: All lipid parameters are excellent.   Medication Adjustments/Labs and Tests Ordered: Current medicines are reviewed at length with the patient today.  Concerns regarding medicines are outlined above.  Medication changes, Labs and Tests ordered today are listed in the Patient Instructions below. There are no Patient Instructions on file for this visit.   Signed, Sanda Klein, MD  12/03/2019 9:57 AM    [facility] [place], [place], [place]  [address] Phone: [phone]; Fax: [phone]

## 2019-12-03 NOTE — Patient Instructions (Addendum)

## 2019-12-15 DIAGNOSIS — H353221 Exudative age-related macular degeneration, left eye, with active choroidal neovascularization: Secondary | ICD-10-CM | POA: Diagnosis not present

## 2019-12-15 DIAGNOSIS — H353111 Nonexudative age-related macular degeneration, right eye, early dry stage: Secondary | ICD-10-CM | POA: Diagnosis not present

## 2020-01-14 DIAGNOSIS — E7849 Other hyperlipidemia: Secondary | ICD-10-CM | POA: Diagnosis not present

## 2020-01-14 DIAGNOSIS — E441 Mild protein-calorie malnutrition: Secondary | ICD-10-CM | POA: Diagnosis not present

## 2020-01-14 DIAGNOSIS — E039 Hypothyroidism, unspecified: Secondary | ICD-10-CM | POA: Diagnosis not present

## 2020-01-14 DIAGNOSIS — D649 Anemia, unspecified: Secondary | ICD-10-CM | POA: Diagnosis not present

## 2020-01-14 DIAGNOSIS — E782 Mixed hyperlipidemia: Secondary | ICD-10-CM | POA: Diagnosis not present

## 2020-01-14 DIAGNOSIS — R7301 Impaired fasting glucose: Secondary | ICD-10-CM | POA: Diagnosis not present

## 2020-01-19 DIAGNOSIS — H353230 Exudative age-related macular degeneration, bilateral, stage unspecified: Secondary | ICD-10-CM | POA: Diagnosis not present

## 2020-01-19 DIAGNOSIS — H353221 Exudative age-related macular degeneration, left eye, with active choroidal neovascularization: Secondary | ICD-10-CM | POA: Diagnosis not present

## 2020-01-28 DIAGNOSIS — H353131 Nonexudative age-related macular degeneration, bilateral, early dry stage: Secondary | ICD-10-CM | POA: Diagnosis not present

## 2020-01-28 DIAGNOSIS — I10 Essential (primary) hypertension: Secondary | ICD-10-CM | POA: Diagnosis not present

## 2020-01-28 DIAGNOSIS — E039 Hypothyroidism, unspecified: Secondary | ICD-10-CM | POA: Diagnosis not present

## 2020-01-28 DIAGNOSIS — I2510 Atherosclerotic heart disease of native coronary artery without angina pectoris: Secondary | ICD-10-CM | POA: Diagnosis not present

## 2020-01-28 DIAGNOSIS — E782 Mixed hyperlipidemia: Secondary | ICD-10-CM | POA: Diagnosis not present

## 2020-02-05 ENCOUNTER — Telehealth: Payer: Self-pay | Admitting: Cardiovascular Disease

## 2020-02-05 NOTE — Telephone Encounter (Signed)
Pt c/o medication issue:  1. Name of Medication: isosorbide mononitrate (IMDUR) 30 MG 24 hr tablet  2. How are you currently taking this medication (dosage and times per day)? 1/2 tablet (15 mg) daily  3. Are you having a reaction (difficulty breathing--STAT)? No   4. What is your medication issue? Ovid Curd,  [place] at [facility] wanted to know if the patient could be put on the immediate release of the medication or a lower dose of this medicine. The PA is worried about splitting the medicine because she would actually be getting the full 30 mg at once.  Please call Ovid Curd to discuss the option to change the medication.  Ovid Curd also mentioned that the patient has updated her pharmacy and now uses [facility].

## 2020-02-05 NOTE — Telephone Encounter (Signed)
Spoke with Ovid Curd.  He would like to know  If patient  Could take  Isosorbide mono( IR)  Which is a immediate release  Instead of  Isosorbide mono(  ER)which is extended release    Because the  Immediate release of medication     Patient is currently  Taking Imdur ( isosorbide mononitrate 15 mg ) 1/2 tablet of 30 mg   Rn informed Ovid Curd , Dr Sallyanne Kuster  Had prescribed the medication , will defer to him  If changes are needed.

## 2020-02-09 DIAGNOSIS — H353131 Nonexudative age-related macular degeneration, bilateral, early dry stage: Secondary | ICD-10-CM | POA: Diagnosis not present

## 2020-02-09 DIAGNOSIS — I2510 Atherosclerotic heart disease of native coronary artery without angina pectoris: Secondary | ICD-10-CM | POA: Diagnosis not present

## 2020-02-09 DIAGNOSIS — E782 Mixed hyperlipidemia: Secondary | ICD-10-CM | POA: Diagnosis not present

## 2020-02-09 DIAGNOSIS — I10 Essential (primary) hypertension: Secondary | ICD-10-CM | POA: Diagnosis not present

## 2020-02-09 DIAGNOSIS — E039 Hypothyroidism, unspecified: Secondary | ICD-10-CM | POA: Diagnosis not present

## 2020-02-09 MED ORDER — ISOSORBIDE DINITRATE 10 MG PO TABS: 10.0000 mg | ORAL_TABLET | Freq: Two times a day (BID) | ORAL | 3 refills | Status: DC

## 2020-02-09 NOTE — Telephone Encounter (Signed)
Spoke to [place], [place]. He has been advised that the patient may take the Isosorbide Dinitrate 10 mg twice daily to replace the Imdur. He stated that would be great and he would call the patient to let them know of the change.   New prescription has been sent into [facility].

## 2020-02-09 NOTE — Telephone Encounter (Signed)
It's OK to split the tab in half. The chemical releases the nitrate moiety gradually. The tablet itself is not sustained release. Can switch to isosorbide dinitrate 10 mg twice daily if preferred.Marland Kitchen

## 2020-02-23 DIAGNOSIS — H35373 Puckering of macula, bilateral: Secondary | ICD-10-CM | POA: Diagnosis not present

## 2020-02-23 DIAGNOSIS — H353112 Nonexudative age-related macular degeneration, right eye, intermediate dry stage: Secondary | ICD-10-CM | POA: Diagnosis not present

## 2020-02-23 DIAGNOSIS — H353221 Exudative age-related macular degeneration, left eye, with active choroidal neovascularization: Secondary | ICD-10-CM | POA: Diagnosis not present

## 2020-02-23 DIAGNOSIS — Z961 Presence of intraocular lens: Secondary | ICD-10-CM | POA: Diagnosis not present

## 2020-03-03 DIAGNOSIS — I10 Essential (primary) hypertension: Secondary | ICD-10-CM | POA: Diagnosis not present

## 2020-03-03 DIAGNOSIS — K219 Gastro-esophageal reflux disease without esophagitis: Secondary | ICD-10-CM | POA: Diagnosis not present

## 2020-03-11 DIAGNOSIS — I2510 Atherosclerotic heart disease of native coronary artery without angina pectoris: Secondary | ICD-10-CM | POA: Diagnosis not present

## 2020-03-11 DIAGNOSIS — E782 Mixed hyperlipidemia: Secondary | ICD-10-CM | POA: Diagnosis not present

## 2020-03-11 DIAGNOSIS — H353131 Nonexudative age-related macular degeneration, bilateral, early dry stage: Secondary | ICD-10-CM | POA: Diagnosis not present

## 2020-03-11 DIAGNOSIS — I10 Essential (primary) hypertension: Secondary | ICD-10-CM | POA: Diagnosis not present

## 2020-03-11 DIAGNOSIS — E039 Hypothyroidism, unspecified: Secondary | ICD-10-CM | POA: Diagnosis not present

## 2020-03-29 DIAGNOSIS — H353221 Exudative age-related macular degeneration, left eye, with active choroidal neovascularization: Secondary | ICD-10-CM | POA: Diagnosis not present

## 2020-04-01 ENCOUNTER — Other Ambulatory Visit: Payer: Self-pay | Admitting: Cardiovascular Disease

## 2020-05-03 DIAGNOSIS — H353221 Exudative age-related macular degeneration, left eye, with active choroidal neovascularization: Secondary | ICD-10-CM | POA: Diagnosis not present

## 2020-05-20 DIAGNOSIS — E039 Hypothyroidism, unspecified: Secondary | ICD-10-CM | POA: Diagnosis not present

## 2020-05-20 DIAGNOSIS — E7849 Other hyperlipidemia: Secondary | ICD-10-CM | POA: Diagnosis not present

## 2020-05-20 DIAGNOSIS — K219 Gastro-esophageal reflux disease without esophagitis: Secondary | ICD-10-CM | POA: Diagnosis not present

## 2020-05-20 DIAGNOSIS — I10 Essential (primary) hypertension: Secondary | ICD-10-CM | POA: Diagnosis not present

## 2020-06-14 DIAGNOSIS — H353221 Exudative age-related macular degeneration, left eye, with active choroidal neovascularization: Secondary | ICD-10-CM | POA: Diagnosis not present

## 2020-06-14 DIAGNOSIS — Z961 Presence of intraocular lens: Secondary | ICD-10-CM | POA: Diagnosis not present

## 2020-06-14 DIAGNOSIS — H35373 Puckering of macula, bilateral: Secondary | ICD-10-CM | POA: Diagnosis not present

## 2020-06-24 DIAGNOSIS — E7849 Other hyperlipidemia: Secondary | ICD-10-CM | POA: Diagnosis not present

## 2020-06-24 DIAGNOSIS — I10 Essential (primary) hypertension: Secondary | ICD-10-CM | POA: Diagnosis not present

## 2020-06-24 DIAGNOSIS — K219 Gastro-esophageal reflux disease without esophagitis: Secondary | ICD-10-CM | POA: Diagnosis not present

## 2020-06-24 DIAGNOSIS — E039 Hypothyroidism, unspecified: Secondary | ICD-10-CM | POA: Diagnosis not present

## 2020-07-12 DIAGNOSIS — H353221 Exudative age-related macular degeneration, left eye, with active choroidal neovascularization: Secondary | ICD-10-CM | POA: Diagnosis not present

## 2020-07-13 DIAGNOSIS — R7301 Impaired fasting glucose: Secondary | ICD-10-CM | POA: Diagnosis not present

## 2020-07-13 DIAGNOSIS — Z0000 Encounter for general adult medical examination without abnormal findings: Secondary | ICD-10-CM | POA: Diagnosis not present

## 2020-07-13 DIAGNOSIS — E782 Mixed hyperlipidemia: Secondary | ICD-10-CM | POA: Diagnosis not present

## 2020-07-13 DIAGNOSIS — Z6820 Body mass index (BMI) 20.0-20.9, adult: Secondary | ICD-10-CM | POA: Diagnosis not present

## 2020-07-13 DIAGNOSIS — L299 Pruritus, unspecified: Secondary | ICD-10-CM | POA: Diagnosis not present

## 2020-07-20 DIAGNOSIS — E039 Hypothyroidism, unspecified: Secondary | ICD-10-CM | POA: Diagnosis not present

## 2020-07-20 DIAGNOSIS — Z23 Encounter for immunization: Secondary | ICD-10-CM | POA: Diagnosis not present

## 2020-07-20 DIAGNOSIS — I10 Essential (primary) hypertension: Secondary | ICD-10-CM | POA: Diagnosis not present

## 2020-07-20 DIAGNOSIS — Z0001 Encounter for general adult medical examination with abnormal findings: Secondary | ICD-10-CM | POA: Diagnosis not present

## 2020-07-20 DIAGNOSIS — I2510 Atherosclerotic heart disease of native coronary artery without angina pectoris: Secondary | ICD-10-CM | POA: Diagnosis not present

## 2020-07-20 DIAGNOSIS — E782 Mixed hyperlipidemia: Secondary | ICD-10-CM | POA: Diagnosis not present

## 2020-08-09 DIAGNOSIS — H353221 Exudative age-related macular degeneration, left eye, with active choroidal neovascularization: Secondary | ICD-10-CM | POA: Diagnosis not present

## 2020-08-25 DIAGNOSIS — E7849 Other hyperlipidemia: Secondary | ICD-10-CM | POA: Diagnosis not present

## 2020-08-25 DIAGNOSIS — K219 Gastro-esophageal reflux disease without esophagitis: Secondary | ICD-10-CM | POA: Diagnosis not present

## 2020-08-25 DIAGNOSIS — D638 Anemia in other chronic diseases classified elsewhere: Secondary | ICD-10-CM | POA: Diagnosis not present

## 2020-08-25 DIAGNOSIS — I10 Essential (primary) hypertension: Secondary | ICD-10-CM | POA: Diagnosis not present

## 2020-08-30 DIAGNOSIS — J019 Acute sinusitis, unspecified: Secondary | ICD-10-CM | POA: Diagnosis not present

## 2020-09-06 DIAGNOSIS — Z961 Presence of intraocular lens: Secondary | ICD-10-CM | POA: Diagnosis not present

## 2020-09-06 DIAGNOSIS — H353111 Nonexudative age-related macular degeneration, right eye, early dry stage: Secondary | ICD-10-CM | POA: Diagnosis not present

## 2020-09-06 DIAGNOSIS — H353221 Exudative age-related macular degeneration, left eye, with active choroidal neovascularization: Secondary | ICD-10-CM | POA: Diagnosis not present

## 2020-09-06 DIAGNOSIS — H35373 Puckering of macula, bilateral: Secondary | ICD-10-CM | POA: Diagnosis not present

## 2020-09-06 DIAGNOSIS — H353112 Nonexudative age-related macular degeneration, right eye, intermediate dry stage: Secondary | ICD-10-CM | POA: Diagnosis not present

## 2020-09-27 ENCOUNTER — Other Ambulatory Visit: Payer: Self-pay | Admitting: Cardiovascular Disease

## 2020-10-01 DIAGNOSIS — I10 Essential (primary) hypertension: Secondary | ICD-10-CM | POA: Diagnosis not present

## 2020-10-01 DIAGNOSIS — E7849 Other hyperlipidemia: Secondary | ICD-10-CM | POA: Diagnosis not present

## 2020-10-01 DIAGNOSIS — K219 Gastro-esophageal reflux disease without esophagitis: Secondary | ICD-10-CM | POA: Diagnosis not present

## 2020-10-11 DIAGNOSIS — H353221 Exudative age-related macular degeneration, left eye, with active choroidal neovascularization: Secondary | ICD-10-CM | POA: Diagnosis not present

## 2020-10-30 DIAGNOSIS — E039 Hypothyroidism, unspecified: Secondary | ICD-10-CM | POA: Diagnosis not present

## 2020-10-30 DIAGNOSIS — E782 Mixed hyperlipidemia: Secondary | ICD-10-CM | POA: Diagnosis not present

## 2020-10-30 DIAGNOSIS — H353131 Nonexudative age-related macular degeneration, bilateral, early dry stage: Secondary | ICD-10-CM | POA: Diagnosis not present

## 2020-10-30 DIAGNOSIS — R7301 Impaired fasting glucose: Secondary | ICD-10-CM | POA: Diagnosis not present

## 2020-10-30 DIAGNOSIS — E441 Mild protein-calorie malnutrition: Secondary | ICD-10-CM | POA: Diagnosis not present

## 2020-10-30 DIAGNOSIS — G3184 Mild cognitive impairment, so stated: Secondary | ICD-10-CM | POA: Diagnosis not present

## 2020-10-30 DIAGNOSIS — I10 Essential (primary) hypertension: Secondary | ICD-10-CM | POA: Diagnosis not present

## 2020-10-30 DIAGNOSIS — D638 Anemia in other chronic diseases classified elsewhere: Secondary | ICD-10-CM | POA: Diagnosis not present

## 2020-11-04 DIAGNOSIS — K219 Gastro-esophageal reflux disease without esophagitis: Secondary | ICD-10-CM | POA: Diagnosis not present

## 2020-11-04 DIAGNOSIS — I10 Essential (primary) hypertension: Secondary | ICD-10-CM | POA: Diagnosis not present

## 2020-11-04 DIAGNOSIS — E7849 Other hyperlipidemia: Secondary | ICD-10-CM | POA: Diagnosis not present

## 2020-11-04 DIAGNOSIS — L989 Disorder of the skin and subcutaneous tissue, unspecified: Secondary | ICD-10-CM | POA: Diagnosis not present

## 2020-11-25 ENCOUNTER — Other Ambulatory Visit: Payer: Self-pay | Admitting: Cardiovascular Disease

## 2020-11-29 DIAGNOSIS — E7849 Other hyperlipidemia: Secondary | ICD-10-CM | POA: Diagnosis not present

## 2020-11-29 DIAGNOSIS — I10 Essential (primary) hypertension: Secondary | ICD-10-CM | POA: Diagnosis not present

## 2020-11-29 DIAGNOSIS — K219 Gastro-esophageal reflux disease without esophagitis: Secondary | ICD-10-CM | POA: Diagnosis not present

## 2020-12-06 DIAGNOSIS — H353221 Exudative age-related macular degeneration, left eye, with active choroidal neovascularization: Secondary | ICD-10-CM | POA: Diagnosis not present

## 2020-12-31 ENCOUNTER — Ambulatory Visit: Payer: Medicare Other | Admitting: Cardiovascular Disease

## 2020-12-31 ENCOUNTER — Encounter: Payer: Self-pay | Admitting: Cardiovascular Disease

## 2020-12-31 ENCOUNTER — Other Ambulatory Visit: Payer: Self-pay

## 2020-12-31 VITALS — BP 148/60 | HR 64 | Ht 64.0 in | Wt 111.2 lb

## 2020-12-31 DIAGNOSIS — I10 Essential (primary) hypertension: Secondary | ICD-10-CM

## 2020-12-31 DIAGNOSIS — I25118 Atherosclerotic heart disease of native coronary artery with other forms of angina pectoris: Secondary | ICD-10-CM

## 2020-12-31 DIAGNOSIS — E7800 Pure hypercholesterolemia, unspecified: Secondary | ICD-10-CM | POA: Diagnosis not present

## 2020-12-31 NOTE — Progress Notes (Signed)
Cardiology Office Note    Date:  01/07/2021   ID:  Charleston Poot, DOB Dec 11, 1924, MRN [id number]  PCP:  Celene Squibb, MD  Cardiologist:   Sanda Klein, MD   Chief Complaint  Patient presents with  . Coronary Artery Disease    History of Present Illness:  [name] is a 87 y.o. female with history of coronary artery disease, now roughly 13 years following bypass surgery (CABG 10/28/2005 LIMA to LAD,SVG to OM,SVG to distal RCA). She has not had new coronary events since then.   She has not had any chest discomfort since her last appointment.  Limited activity, but had no trouble picking up tree limbs in the backyard.  The patient specifically denies any chest pain at rest exertion, dyspnea at rest or with exertion, orthopnea, paroxysmal nocturnal dyspnea, syncope, palpitations, focal neurological deficits, intermittent claudication, lower extremity edema, unexplained weight gain, cough, hemoptysis or wheezing.  Systolic blood pressure tends to run a little bit on the high side in the 517G, her diastolic blood pressure is often low in the 50s.  Labs performed last October show all lipid parameters in target range.  A1c was 61%.  She remains very lean, with stable BMI around 19.  Past Medical History:  Diagnosis Date  . CAD (coronary artery disease)    CABG 10/28/2005 LIMA to LAD,SVG to OM,SVG to distal RCA  . Cancer (HCC)    vaginal  . Carotid atherosclerosis    minor  . Coronary artery disease   . Hyperlipidemia   . Hypertension   . Systemic hypertension     Past Surgical History:  Procedure Laterality Date  . ABDOMINAL HYSTERECTOMY    . ABDOMINAL HYSTERECTOMY  1966  . CARDIAC CATHETERIZATION  10/27/2005   left main and severe 2 vessel disease.  Marland Kitchen CATARACT EXTRACTION  1998 & 2004  . CESAREAN SECTION    . CHOLECYSTECTOMY    . CORONARY ARTERY BYPASS GRAFT    . CORONARY ARTERY BYPASS GRAFT  10/28/2005   LIMA to LAD,SVG to OM,SVG to distal RCA  . NM MYOCAR PERF WALL  MOTION  01/11/2010   normal  . US ECHOCARDIOGRAPHY  08/01/2011   mild to mod TR, ca+ AOV leaflets, AOV mildly sclerotic    Current Medications: Outpatient Medications Prior to Visit  Medication Sig Dispense Refill  . acetaminophen (TYLENOL) 650 MG CR tablet Take 650 mg by mouth at bedtime. Arthritis Pain    . amLODipine (NORVASC) 2.5 MG tablet Take 2.5 mg by mouth daily.    Marland Kitchen aspirin 81 MG chewable tablet Chew 81 mg by mouth at bedtime.    Marland Kitchen DEXILANT 60 MG capsule Take 60 mg by mouth daily.     . furosemide (LASIX) 20 MG tablet Take 1 tablet by mouth every morning.    . Homeopathic Products (LEG CRAMPS PO) Take 2 capsules by mouth at bedtime.    . isosorbide dinitrate (ISORDIL) 10 MG tablet TAKE ONE TABLET BY MOUTH TWICE A DAY 180 tablet 1  . levothyroxine (SYNTHROID, LEVOTHROID) 50 MCG tablet Take 50 mcg by mouth daily before breakfast.     . losartan (COZAAR) 50 MG tablet TAKE ONE TABLET (50MG  TOTAL) BY MOUTH EVERY EVENING 90 tablet 1  . lovastatin (MEVACOR) 20 MG tablet Take 20 mg by mouth at bedtime.    . metoprolol tartrate (LOPRESSOR) 25 MG tablet TAKE ONE TABLET (25MG  TOTAL) BY MOUTH TWO TIMES DAILY. TAKE MEDICATION AS CLOSE AS POSSIBLE TO 12 HOURS  APART. 180 tablet 1  . Multiple Vitamin (MULTIVITAMIN WITH MINERALS) TABS tablet Take 1 tablet by mouth daily.    . Multiple Vitamins-Minerals (PRESERVISION AREDS 2 PO) Take 1 tablet by mouth 2 (two) times daily.    . nitroGLYCERIN (NITROSTAT) 0.4 MG SL tablet Place 1 tablet (0.4 mg total) under the tongue every 5 (five) minutes as needed. 25 tablet 3  . potassium chloride (KLOR-CON) 10 MEQ tablet Take 10 mEq by mouth daily. With lasix dose    . losartan (COZAAR) 100 MG tablet Take 50 mg by mouth daily.     No facility-administered medications prior to visit.     Allergies:   Lipitor [atorvastatin], Aspirin, Celecoxib, Levaquin [levofloxacin in d5w], Sulfa antibiotics, and Sulfonamide derivatives   Social History   Socioeconomic  History  . Marital status: Widowed    Spouse name: Not on file  . Number of children: Not on file  . Years of education: Not on file  . Highest education level: Not on file  Occupational History  . Not on file  Tobacco Use  . Smoking status: Never Smoker  . Smokeless tobacco: Never Used  Substance and Sexual Activity  . Alcohol use: No  . Drug use: No  . Sexual activity: Not Currently  Other Topics Concern  . Not on file  Social History Narrative   ** Merged History Encounter **       Social Determinants of Health   Financial Resource Strain: Not on file  Food Insecurity: Not on file  Transportation Needs: Not on file  Physical Activity: Not on file  Stress: Not on file  Social Connections: Not on file     Family History:  The patient's family history includes Cancer in her brother, brother, and sister; Diabetes in her brother; Heart attack in her brother, brother, and sister; Stroke in her father.   ROS:   Please see the history of present illness.    ROS All other systems are reviewed and are negative.   PHYSICAL EXAM:   VS:  BP (!) 148/60   Pulse 64   Ht 5\' 4"  (1.626 m)   Wt 111 lb 3.2 oz (50.4 kg)   SpO2 98%   BMI 19.09 kg/m      General: Alert, oriented x3, no distress, very lean Head: no evidence of trauma, PERRL, EOMI, no exophtalmos or lid lag, no myxedema, no xanthelasma; normal ears, nose and oropharynx Neck: normal jugular venous pulsations and no hepatojugular reflux; brisk carotid pulses without delay and no carotid bruits Chest: clear to auscultation, no signs of consolidation by percussion or palpation, normal fremitus, symmetrical and full respiratory excursions Cardiovascular: normal position and quality of the apical impulse, regular rhythm, normal first and widely split second heart sounds, no murmurs, rubs or gallops Abdomen: no tenderness or distention, no masses by palpation, no abnormal pulsatility or arterial bruits, normal bowel sounds, no  hepatosplenomegaly Extremities: no clubbing, cyanosis or edema; 2+ radial, ulnar and brachial pulses bilaterally; 2+ right femoral, posterior tibial and dorsalis pedis pulses; 2+ left femoral, posterior tibial and dorsalis pedis pulses; no subclavian or femoral bruits Neurological: grossly nonfocal Psych: Normal mood and affect   Wt Readings from Last 3 Encounters:  12/31/20 111 lb 3.2 oz (50.4 kg)  12/03/19 110 lb (49.9 kg)  01/27/19 111 lb (50.3 kg)      Studies/Labs Reviewed:   EKG:  EKG is ordered today.  Looks very similar to previous tracings and shows normal sinus rhythm with  old right bundle branch block.   Recent Labs: 07/14/2019 Cholesterol 153, HDL 55, LDL 61, triglycerides 82 Hemoglobin A1c 5.7%, hemoglobin 11.8, creatinine 0.88, normal liver function tests  07/13/2020 Total cholesterol 117, HDL 46, triglycerides 75, LDL 60 Hemoglobin 10.6, creatinine 0.93, normal TSH, hemoglobin A1c 6.1%  ASSESSMENT:    1. Coronary artery disease of native artery of native heart with stable angina pectoris ([place])   2. Essential hypertension   3. Pure hypercholesterolemia      PLAN:  In order of problems listed above:  1. CAD: Has very infrequent angina pectoris and this often happens outside in the winter months when it is cold.  Normal nuclear stress test in November 2015.  She is 87 years old and would prefer noninvasive management.  We again reviewed the difference being stable angina and unstable angina pectoris, advising her to seek urgent medical care for the latter. 2. HTN: Control is fair.  We have previously has reduced her medications for symptomatic diastolic hypotension.  Would allow systolic blood pressure up to about 150. 3. HLP: All lipid parameters are excellent on the current dose of statin.   Medication Adjustments/Labs and Tests Ordered: Current medicines are reviewed at length with the patient today.  Concerns regarding medicines are outlined above.   Medication changes, Labs and Tests ordered today are listed in the Patient Instructions below. Patient Instructions  Medication Instructions:  No changes *If you need a refill on your cardiac medications before your next appointment, please call your pharmacy*   Lab Work: None ordered If you have labs (blood work) drawn today and your tests are completely normal, you will receive your results only by: Marland Kitchen MyChart Message (if you have MyChart) OR . A paper copy in the mail If you have any lab test that is abnormal or we need to change your treatment, we will call you to review the results.   Testing/Procedures: None ordered   Follow-Up: At [facility], you and your health needs are our priority.  As part of our continuing mission to provide you with exceptional heart care, we have created designated Provider Care Teams.  These Care Teams include your primary Cardiologist (physician) and Advanced Practice Providers (APPs -  Physician Assistants and Nurse Practitioners) who all work together to provide you with the care you need, when you need it.  We recommend signing up for the patient portal called "MyChart".  Sign up information is provided on this After Visit Summary.  MyChart is used to connect with patients for Virtual Visits (Telemedicine).  Patients are able to view lab/test results, encounter notes, upcoming appointments, etc.  Non-urgent messages can be sent to your provider as well.   To learn more about what you can do with MyChart, go to [email or web address].    Your next appointment:   12 month(s)  The format for your next appointment:   In Person  Provider:   You may see Sanda Klein, MD or one of the following Advanced Practice Providers on your designated Care Team:    Almyra Deforest, PA-C  Fabian Sharp, [place] or   Roby Lofts, PA-C       Signed, Sanda Klein, MD  01/07/2021 2:01 PM    [facility] [place], [place], [place]   [address] Phone: [phone]; Fax: [phone]

## 2020-12-31 NOTE — Patient Instructions (Signed)

## 2021-01-03 DIAGNOSIS — H353221 Exudative age-related macular degeneration, left eye, with active choroidal neovascularization: Secondary | ICD-10-CM | POA: Diagnosis not present

## 2021-01-04 DIAGNOSIS — E440 Moderate protein-calorie malnutrition: Secondary | ICD-10-CM | POA: Diagnosis not present

## 2021-01-04 DIAGNOSIS — R7301 Impaired fasting glucose: Secondary | ICD-10-CM | POA: Diagnosis not present

## 2021-01-04 DIAGNOSIS — E782 Mixed hyperlipidemia: Secondary | ICD-10-CM | POA: Diagnosis not present

## 2021-01-04 DIAGNOSIS — I25118 Atherosclerotic heart disease of native coronary artery with other forms of angina pectoris: Secondary | ICD-10-CM | POA: Diagnosis not present

## 2021-01-04 DIAGNOSIS — I25111 Atherosclerotic heart disease of native coronary artery with angina pectoris with documented spasm: Secondary | ICD-10-CM | POA: Diagnosis not present

## 2021-01-07 ENCOUNTER — Encounter: Payer: Self-pay | Admitting: Cardiovascular Disease

## 2021-01-12 DIAGNOSIS — D638 Anemia in other chronic diseases classified elsewhere: Secondary | ICD-10-CM | POA: Diagnosis not present

## 2021-01-12 DIAGNOSIS — H353131 Nonexudative age-related macular degeneration, bilateral, early dry stage: Secondary | ICD-10-CM | POA: Diagnosis not present

## 2021-01-12 DIAGNOSIS — E441 Mild protein-calorie malnutrition: Secondary | ICD-10-CM | POA: Diagnosis not present

## 2021-01-12 DIAGNOSIS — I10 Essential (primary) hypertension: Secondary | ICD-10-CM | POA: Diagnosis not present

## 2021-01-12 DIAGNOSIS — G3184 Mild cognitive impairment, so stated: Secondary | ICD-10-CM | POA: Diagnosis not present

## 2021-01-12 DIAGNOSIS — R7301 Impaired fasting glucose: Secondary | ICD-10-CM | POA: Diagnosis not present

## 2021-01-12 DIAGNOSIS — E039 Hypothyroidism, unspecified: Secondary | ICD-10-CM | POA: Diagnosis not present

## 2021-01-12 DIAGNOSIS — I2510 Atherosclerotic heart disease of native coronary artery without angina pectoris: Secondary | ICD-10-CM | POA: Diagnosis not present

## 2021-01-12 DIAGNOSIS — E440 Moderate protein-calorie malnutrition: Secondary | ICD-10-CM | POA: Diagnosis not present

## 2021-01-12 DIAGNOSIS — E782 Mixed hyperlipidemia: Secondary | ICD-10-CM | POA: Diagnosis not present

## 2021-01-30 DIAGNOSIS — I10 Essential (primary) hypertension: Secondary | ICD-10-CM | POA: Diagnosis not present

## 2021-01-30 DIAGNOSIS — K219 Gastro-esophageal reflux disease without esophagitis: Secondary | ICD-10-CM | POA: Diagnosis not present

## 2021-01-31 DIAGNOSIS — H353221 Exudative age-related macular degeneration, left eye, with active choroidal neovascularization: Secondary | ICD-10-CM | POA: Diagnosis not present

## 2021-01-31 DIAGNOSIS — H353111 Nonexudative age-related macular degeneration, right eye, early dry stage: Secondary | ICD-10-CM | POA: Diagnosis not present

## 2021-01-31 DIAGNOSIS — Z961 Presence of intraocular lens: Secondary | ICD-10-CM | POA: Diagnosis not present

## 2021-01-31 DIAGNOSIS — H353112 Nonexudative age-related macular degeneration, right eye, intermediate dry stage: Secondary | ICD-10-CM | POA: Diagnosis not present

## 2021-01-31 DIAGNOSIS — H35373 Puckering of macula, bilateral: Secondary | ICD-10-CM | POA: Diagnosis not present

## 2021-03-07 DIAGNOSIS — H353221 Exudative age-related macular degeneration, left eye, with active choroidal neovascularization: Secondary | ICD-10-CM | POA: Diagnosis not present

## 2021-03-25 DIAGNOSIS — I10 Essential (primary) hypertension: Secondary | ICD-10-CM | POA: Diagnosis not present

## 2021-03-25 DIAGNOSIS — E785 Hyperlipidemia, unspecified: Secondary | ICD-10-CM | POA: Diagnosis not present

## 2021-04-11 DIAGNOSIS — H353221 Exudative age-related macular degeneration, left eye, with active choroidal neovascularization: Secondary | ICD-10-CM | POA: Diagnosis not present

## 2021-05-09 DIAGNOSIS — H353221 Exudative age-related macular degeneration, left eye, with active choroidal neovascularization: Secondary | ICD-10-CM | POA: Diagnosis not present

## 2021-05-31 ENCOUNTER — Other Ambulatory Visit: Payer: Self-pay | Admitting: Cardiovascular Disease

## 2021-06-01 DIAGNOSIS — E785 Hyperlipidemia, unspecified: Secondary | ICD-10-CM | POA: Diagnosis not present

## 2021-06-01 DIAGNOSIS — I10 Essential (primary) hypertension: Secondary | ICD-10-CM | POA: Diagnosis not present

## 2021-06-13 DIAGNOSIS — H353221 Exudative age-related macular degeneration, left eye, with active choroidal neovascularization: Secondary | ICD-10-CM | POA: Diagnosis not present

## 2021-06-22 DIAGNOSIS — K219 Gastro-esophageal reflux disease without esophagitis: Secondary | ICD-10-CM | POA: Diagnosis not present

## 2021-06-22 DIAGNOSIS — H9221 Otorrhagia, right ear: Secondary | ICD-10-CM | POA: Diagnosis not present

## 2021-06-22 DIAGNOSIS — I10 Essential (primary) hypertension: Secondary | ICD-10-CM | POA: Diagnosis not present

## 2021-06-22 DIAGNOSIS — M79605 Pain in left leg: Secondary | ICD-10-CM | POA: Diagnosis not present

## 2021-06-22 DIAGNOSIS — E785 Hyperlipidemia, unspecified: Secondary | ICD-10-CM | POA: Diagnosis not present

## 2021-07-05 DIAGNOSIS — H9221 Otorrhagia, right ear: Secondary | ICD-10-CM | POA: Diagnosis not present

## 2021-07-05 DIAGNOSIS — I10 Essential (primary) hypertension: Secondary | ICD-10-CM | POA: Diagnosis not present

## 2021-07-05 DIAGNOSIS — L210 Seborrhea capitis: Secondary | ICD-10-CM | POA: Diagnosis not present

## 2021-07-05 DIAGNOSIS — Z23 Encounter for immunization: Secondary | ICD-10-CM | POA: Diagnosis not present

## 2021-07-05 DIAGNOSIS — K219 Gastro-esophageal reflux disease without esophagitis: Secondary | ICD-10-CM | POA: Diagnosis not present

## 2021-07-05 DIAGNOSIS — M79605 Pain in left leg: Secondary | ICD-10-CM | POA: Diagnosis not present

## 2021-07-11 DIAGNOSIS — H35373 Puckering of macula, bilateral: Secondary | ICD-10-CM | POA: Diagnosis not present

## 2021-07-11 DIAGNOSIS — H353221 Exudative age-related macular degeneration, left eye, with active choroidal neovascularization: Secondary | ICD-10-CM | POA: Diagnosis not present

## 2021-07-11 DIAGNOSIS — H47322 Drusen of optic disc, left eye: Secondary | ICD-10-CM | POA: Diagnosis not present

## 2021-07-11 DIAGNOSIS — H353111 Nonexudative age-related macular degeneration, right eye, early dry stage: Secondary | ICD-10-CM | POA: Diagnosis not present

## 2021-07-11 DIAGNOSIS — Z961 Presence of intraocular lens: Secondary | ICD-10-CM | POA: Diagnosis not present

## 2021-07-14 DIAGNOSIS — I10 Essential (primary) hypertension: Secondary | ICD-10-CM | POA: Diagnosis not present

## 2021-07-14 DIAGNOSIS — E039 Hypothyroidism, unspecified: Secondary | ICD-10-CM | POA: Diagnosis not present

## 2021-07-14 DIAGNOSIS — R7301 Impaired fasting glucose: Secondary | ICD-10-CM | POA: Diagnosis not present

## 2021-07-20 DIAGNOSIS — R7301 Impaired fasting glucose: Secondary | ICD-10-CM | POA: Diagnosis not present

## 2021-07-20 DIAGNOSIS — E782 Mixed hyperlipidemia: Secondary | ICD-10-CM | POA: Diagnosis not present

## 2021-07-20 DIAGNOSIS — D509 Iron deficiency anemia, unspecified: Secondary | ICD-10-CM | POA: Diagnosis not present

## 2021-07-20 DIAGNOSIS — E039 Hypothyroidism, unspecified: Secondary | ICD-10-CM | POA: Diagnosis not present

## 2021-07-20 DIAGNOSIS — Z0001 Encounter for general adult medical examination with abnormal findings: Secondary | ICD-10-CM | POA: Diagnosis not present

## 2021-07-20 DIAGNOSIS — I10 Essential (primary) hypertension: Secondary | ICD-10-CM | POA: Diagnosis not present

## 2021-07-20 DIAGNOSIS — I2510 Atherosclerotic heart disease of native coronary artery without angina pectoris: Secondary | ICD-10-CM | POA: Diagnosis not present

## 2021-07-20 DIAGNOSIS — E440 Moderate protein-calorie malnutrition: Secondary | ICD-10-CM | POA: Diagnosis not present

## 2021-07-20 DIAGNOSIS — M1990 Unspecified osteoarthritis, unspecified site: Secondary | ICD-10-CM | POA: Diagnosis not present

## 2021-07-20 DIAGNOSIS — G3184 Mild cognitive impairment, so stated: Secondary | ICD-10-CM | POA: Diagnosis not present

## 2021-07-20 DIAGNOSIS — H3530 Unspecified macular degeneration: Secondary | ICD-10-CM | POA: Diagnosis not present

## 2021-08-08 DIAGNOSIS — H353221 Exudative age-related macular degeneration, left eye, with active choroidal neovascularization: Secondary | ICD-10-CM | POA: Diagnosis not present

## 2021-09-05 DIAGNOSIS — H353221 Exudative age-related macular degeneration, left eye, with active choroidal neovascularization: Secondary | ICD-10-CM | POA: Diagnosis not present

## 2021-10-14 DIAGNOSIS — H353221 Exudative age-related macular degeneration, left eye, with active choroidal neovascularization: Secondary | ICD-10-CM | POA: Diagnosis not present

## 2021-10-24 DIAGNOSIS — H35722 Serous detachment of retinal pigment epithelium, left eye: Secondary | ICD-10-CM | POA: Diagnosis not present

## 2021-10-24 DIAGNOSIS — H353221 Exudative age-related macular degeneration, left eye, with active choroidal neovascularization: Secondary | ICD-10-CM | POA: Diagnosis not present

## 2021-10-24 DIAGNOSIS — H353111 Nonexudative age-related macular degeneration, right eye, early dry stage: Secondary | ICD-10-CM | POA: Diagnosis not present

## 2021-10-25 DIAGNOSIS — L989 Disorder of the skin and subcutaneous tissue, unspecified: Secondary | ICD-10-CM | POA: Diagnosis not present

## 2021-11-01 DIAGNOSIS — I10 Essential (primary) hypertension: Secondary | ICD-10-CM | POA: Diagnosis not present

## 2021-11-01 DIAGNOSIS — E782 Mixed hyperlipidemia: Secondary | ICD-10-CM | POA: Diagnosis not present

## 2021-11-17 DIAGNOSIS — X32XXXA Exposure to sunlight, initial encounter: Secondary | ICD-10-CM | POA: Diagnosis not present

## 2021-11-17 DIAGNOSIS — L308 Other specified dermatitis: Secondary | ICD-10-CM | POA: Diagnosis not present

## 2021-11-17 DIAGNOSIS — L218 Other seborrheic dermatitis: Secondary | ICD-10-CM | POA: Diagnosis not present

## 2021-11-17 DIAGNOSIS — L280 Lichen simplex chronicus: Secondary | ICD-10-CM | POA: Diagnosis not present

## 2021-11-17 DIAGNOSIS — L821 Other seborrheic keratosis: Secondary | ICD-10-CM | POA: Diagnosis not present

## 2021-11-17 DIAGNOSIS — L570 Actinic keratosis: Secondary | ICD-10-CM | POA: Diagnosis not present

## 2021-11-21 DIAGNOSIS — H353221 Exudative age-related macular degeneration, left eye, with active choroidal neovascularization: Secondary | ICD-10-CM | POA: Diagnosis not present

## 2021-11-28 ENCOUNTER — Other Ambulatory Visit: Payer: Self-pay | Admitting: Cardiovascular Disease

## 2021-12-19 DIAGNOSIS — H353221 Exudative age-related macular degeneration, left eye, with active choroidal neovascularization: Secondary | ICD-10-CM | POA: Diagnosis not present

## 2022-01-16 DIAGNOSIS — H35373 Puckering of macula, bilateral: Secondary | ICD-10-CM | POA: Diagnosis not present

## 2022-01-16 DIAGNOSIS — Z961 Presence of intraocular lens: Secondary | ICD-10-CM | POA: Diagnosis not present

## 2022-01-16 DIAGNOSIS — H353221 Exudative age-related macular degeneration, left eye, with active choroidal neovascularization: Secondary | ICD-10-CM | POA: Diagnosis not present

## 2022-01-16 DIAGNOSIS — H353112 Nonexudative age-related macular degeneration, right eye, intermediate dry stage: Secondary | ICD-10-CM | POA: Diagnosis not present

## 2022-01-18 DIAGNOSIS — E782 Mixed hyperlipidemia: Secondary | ICD-10-CM | POA: Diagnosis not present

## 2022-01-18 DIAGNOSIS — D509 Iron deficiency anemia, unspecified: Secondary | ICD-10-CM | POA: Diagnosis not present

## 2022-01-18 DIAGNOSIS — E039 Hypothyroidism, unspecified: Secondary | ICD-10-CM | POA: Diagnosis not present

## 2022-01-18 DIAGNOSIS — R7301 Impaired fasting glucose: Secondary | ICD-10-CM | POA: Diagnosis not present

## 2022-01-25 DIAGNOSIS — E782 Mixed hyperlipidemia: Secondary | ICD-10-CM | POA: Diagnosis not present

## 2022-01-25 DIAGNOSIS — H353221 Exudative age-related macular degeneration, left eye, with active choroidal neovascularization: Secondary | ICD-10-CM | POA: Diagnosis not present

## 2022-01-25 DIAGNOSIS — D509 Iron deficiency anemia, unspecified: Secondary | ICD-10-CM | POA: Diagnosis not present

## 2022-01-25 DIAGNOSIS — G3184 Mild cognitive impairment, so stated: Secondary | ICD-10-CM | POA: Diagnosis not present

## 2022-01-25 DIAGNOSIS — I700 Atherosclerosis of aorta: Secondary | ICD-10-CM | POA: Diagnosis not present

## 2022-01-25 DIAGNOSIS — I25118 Atherosclerotic heart disease of native coronary artery with other forms of angina pectoris: Secondary | ICD-10-CM | POA: Diagnosis not present

## 2022-01-25 DIAGNOSIS — R7301 Impaired fasting glucose: Secondary | ICD-10-CM | POA: Diagnosis not present

## 2022-01-25 DIAGNOSIS — E440 Moderate protein-calorie malnutrition: Secondary | ICD-10-CM | POA: Diagnosis not present

## 2022-01-25 DIAGNOSIS — M1990 Unspecified osteoarthritis, unspecified site: Secondary | ICD-10-CM | POA: Diagnosis not present

## 2022-01-25 DIAGNOSIS — E039 Hypothyroidism, unspecified: Secondary | ICD-10-CM | POA: Diagnosis not present

## 2022-01-25 DIAGNOSIS — I10 Essential (primary) hypertension: Secondary | ICD-10-CM | POA: Diagnosis not present

## 2022-02-13 DIAGNOSIS — H35722 Serous detachment of retinal pigment epithelium, left eye: Secondary | ICD-10-CM | POA: Diagnosis not present

## 2022-02-13 DIAGNOSIS — H353110 Nonexudative age-related macular degeneration, right eye, stage unspecified: Secondary | ICD-10-CM | POA: Diagnosis not present

## 2022-02-13 DIAGNOSIS — H353221 Exudative age-related macular degeneration, left eye, with active choroidal neovascularization: Secondary | ICD-10-CM | POA: Diagnosis not present

## 2022-03-13 DIAGNOSIS — H353221 Exudative age-related macular degeneration, left eye, with active choroidal neovascularization: Secondary | ICD-10-CM | POA: Diagnosis not present

## 2022-03-30 ENCOUNTER — Ambulatory Visit: Payer: Medicare Other | Admitting: Cardiovascular Disease

## 2022-04-10 DIAGNOSIS — H353221 Exudative age-related macular degeneration, left eye, with active choroidal neovascularization: Secondary | ICD-10-CM | POA: Diagnosis not present

## 2022-04-12 ENCOUNTER — Encounter: Payer: Self-pay | Admitting: Cardiovascular Disease

## 2022-04-12 ENCOUNTER — Ambulatory Visit: Payer: Medicare Other | Admitting: Cardiovascular Disease

## 2022-04-12 VITALS — BP 119/58 | HR 58 | Ht 64.0 in | Wt 109.4 lb

## 2022-04-12 DIAGNOSIS — I10 Essential (primary) hypertension: Secondary | ICD-10-CM

## 2022-04-12 DIAGNOSIS — I25118 Atherosclerotic heart disease of native coronary artery with other forms of angina pectoris: Secondary | ICD-10-CM

## 2022-04-12 DIAGNOSIS — E7800 Pure hypercholesterolemia, unspecified: Secondary | ICD-10-CM

## 2022-04-12 MED ORDER — NITROGLYCERIN 0.4 MG SL SUBL: 0.4000 mg | SUBLINGUAL_TABLET | SUBLINGUAL | 3 refills | Status: DC | PRN

## 2022-04-12 NOTE — Patient Instructions (Signed)

## 2022-04-12 NOTE — Progress Notes (Signed)
Cardiology Office Note    Date:  04/12/2022   ID:  Alison, [name] 09-09-1925, MRN [id number]  PCP:  Celene Squibb, MD  Cardiologist:   Sanda Klein, MD   Chief Complaint  Patient presents with   Coronary Artery Disease    History of Present Illness:  [name] is a 88 y.o. female with history of coronary artery disease, now roughly 13 years following bypass surgery (CABG 10/28/2005 LIMA to LAD,SVG to OM,SVG to distal RCA). She has not had new serious coronary events since then.   She occasionally has chest discomfort that resolves with a single sublingual nitroglycerin.  She is requiring nitroglycerin on average less than once a month, most recently about 3 to 4 weeks ago.  She is taking a combination of amlodipine, isosorbide dinitrate and metoprolol as antianginal medications.  She has occasional orthostatic dizziness but has not had falls or syncope.  Activity is limited primarily by pain in her knees rather than shortness of breath, angina or claudication.  She denies leg edema, orthopnea or PND.  She has not had palpitations.  She does have a very broad pulse pressure, with diastolic blood pressure frequently in the 50s.  Today we detected a difference in blood pressure between her right and left upper extremity (lower on the right side by about 20 mmHg).  She does not have symptoms of upper extremity claudication or vertebral steal syndrome.  Metabolic control is excellent with a hemoglobin A1c of 5.8%, LDL 53, HDL 55.  Her BMI is just under 19.   Past Medical History:  Diagnosis Date   CAD (coronary artery disease)    CABG 10/28/2005 LIMA to LAD,SVG to OM,SVG to distal RCA   Cancer ([place])    vaginal   Carotid atherosclerosis    minor   Coronary artery disease    Hyperlipidemia    Hypertension    Systemic hypertension     Past Surgical History:  Procedure Laterality Date   ABDOMINAL HYSTERECTOMY     ABDOMINAL HYSTERECTOMY  1966   CARDIAC CATHETERIZATION   10/27/2005   left main and severe 2 vessel disease.   CATARACT EXTRACTION  1998 & 2004   CESAREAN SECTION     CHOLECYSTECTOMY     CORONARY ARTERY BYPASS GRAFT     CORONARY ARTERY BYPASS GRAFT  10/28/2005   LIMA to LAD,SVG to OM,SVG to distal RCA   NM MYOCAR PERF WALL MOTION  01/11/2010   normal   US ECHOCARDIOGRAPHY  08/01/2011   mild to mod TR, ca+ AOV leaflets, AOV mildly sclerotic    Current Medications: Outpatient Medications Prior to Visit  Medication Sig Dispense Refill   acetaminophen (TYLENOL) 650 MG CR tablet Take 650 mg by mouth at bedtime. Arthritis Pain     amLODipine (NORVASC) 2.5 MG tablet Take 2.5 mg by mouth daily.     aspirin 81 MG chewable tablet Chew 81 mg by mouth at bedtime.     DEXILANT 60 MG capsule Take 60 mg by mouth daily.      furosemide (LASIX) 20 MG tablet Take 1 tablet by mouth every morning.     Homeopathic Products (LEG CRAMPS PO) Take 2 capsules by mouth at bedtime.     isosorbide dinitrate (ISORDIL) 10 MG tablet TAKE ONE TABLET BY MOUTH TWICE A DAY 180 tablet 1   levothyroxine (SYNTHROID, LEVOTHROID) 50 MCG tablet Take 50 mcg by mouth daily before breakfast.      losartan (COZAAR) 50 MG  tablet TAKE ONE TABLET ('50MG'$  TOTAL) BY MOUTH EVERY EVENING 90 tablet 1   lovastatin (MEVACOR) 20 MG tablet Take 20 mg by mouth at bedtime.     metoprolol tartrate (LOPRESSOR) 25 MG tablet TAKE ONE TABLET ('25MG'$  TOTAL) BY MOUTH TWO TIMES DAILY. TAKE MEDICATION AS CLOSE AS POSSIBLE TO 12 HOURS APART. 180 tablet 1   Multiple Vitamin (MULTIVITAMIN WITH MINERALS) TABS tablet Take 1 tablet by mouth daily.     Multiple Vitamins-Minerals (PRESERVISION AREDS 2 PO) Take 1 tablet by mouth 2 (two) times daily.     potassium chloride (KLOR-CON) 10 MEQ tablet Take 10 mEq by mouth daily. With lasix dose     nitroGLYCERIN (NITROSTAT) 0.4 MG SL tablet Place 1 tablet (0.4 mg total) under the tongue every 5 (five) minutes as needed. 25 tablet 3   No facility-administered medications prior  to visit.     Allergies:   Lipitor [atorvastatin], Aspirin, Celecoxib, Levaquin [levofloxacin in d5w], Sulfa antibiotics, and Sulfonamide derivatives   Social History   Socioeconomic History   Marital status: Widowed    Spouse name: Not on file   Number of children: Not on file   Years of education: Not on file   Highest education level: Not on file  Occupational History   Not on file  Tobacco Use   Smoking status: Never   Smokeless tobacco: Never  Substance and Sexual Activity   Alcohol use: No   Drug use: No   Sexual activity: Not Currently  Other Topics Concern   Not on file  Social History Narrative   ** Merged History Encounter **       Social Determinants of Health   Financial Resource Strain: Not on file  Food Insecurity: Not on file  Transportation Needs: Not on file  Physical Activity: Not on file  Stress: Not on file  Social Connections: Not on file     Family History:  The patient's family history includes Cancer in her brother, brother, and sister; Diabetes in her brother; Heart attack in her brother, brother, and sister; Stroke in her father.   ROS:   Please see the history of present illness.    ROS All other systems are reviewed and are negative.   PHYSICAL EXAM:   VS:  BP (!) 119/58 (BP Location: Right Arm)   Pulse (!) 58   Ht '5\' 4"'$  (1.626 m)   Wt 109 lb 6.4 oz (49.6 kg)   SpO2 94%   BMI 18.78 kg/m      General: Alert, oriented x3, no distress, very lean Head: no evidence of trauma, PERRL, EOMI, no exophtalmos or lid lag, no myxedema, no xanthelasma; normal ears, nose and oropharynx Neck: normal jugular venous pulsations and no hepatojugular reflux; brisk carotid pulses without delay and no carotid bruits Chest: clear to auscultation, no signs of consolidation by percussion or palpation, normal fremitus, symmetrical and full respiratory excursions Cardiovascular: normal position and quality of the apical impulse, regular rhythm, normal first  and widely split second heart sounds, no murmurs, rubs or gallops Abdomen: no tenderness or distention, no masses by palpation, no abnormal pulsatility or arterial bruits, normal bowel sounds, no hepatosplenomegaly Extremities: no clubbing, cyanosis or edema; 2+ radial, ulnar and brachial pulses bilaterally; 2+ right femoral, posterior tibial and dorsalis pedis pulses; 2+ left femoral, posterior tibial and dorsalis pedis pulses; no subclavian or femoral bruits Neurological: grossly nonfocal Psych: Normal mood and affect    Wt Readings from Last 3 Encounters:  04/12/22 109  lb 6.4 oz (49.6 kg)  12/31/20 111 lb 3.2 oz (50.4 kg)  12/03/19 110 lb (49.9 kg)      Studies/Labs Reviewed:   EKG:  EKG is ordered today.  It is similar to previous tracings and shows borderline sinus bradycardia and old right bundle branch block.  QTc 456 ms  Recent Labs: 07/14/2019 Cholesterol 153, HDL 55, LDL 61, triglycerides 82 Hemoglobin A1c 5.7%, hemoglobin 11.8, creatinine 0.88, normal liver function tests  07/13/2020 Total cholesterol 117, HDL 46, triglycerides 75, LDL 60 Hemoglobin 10.6, creatinine 0.93, normal TSH, hemoglobin A1c 6.1%  01/18/2022 Cholesterol 121, HDL 55, LDL 53, triglycerides 62 Hemoglobin A1c 5.8% Hemoglobin 10.4, creatinine 0.73, ALT 17, TSH 1.02  ASSESSMENT:    1. Coronary artery disease of native artery of native heart with stable angina pectoris ([place])   2. Essential hypertension   3. Pure hypercholesterolemia      PLAN:  In order of problems listed above:  CAD: Has very infrequent stable angina pectoris, more common in the cold winter months.  We have reviewed the difference between stable and unstable angina and when she should seek urgent medical attention.  She prefers conservative management.  On aspirin and lipid-lowering therapy. HTN: Good control.  She has had problems with symptomatic diastolic hypotension in the past and has a very broad pulse pressure.   Tolerate systolic blood pressures of up to 150.  If she develops symptomatic hypotension, will decrease her losartan first, since her other medications have value as antianginals. HLP: All parameters are in appropriate range on the current dose of statin.  No change in medicines.  Medication Adjustments/Labs and Tests Ordered: Current medicines are reviewed at length with the patient today.  Concerns regarding medicines are outlined above.  Medication changes, Labs and Tests ordered today are listed in the Patient Instructions below. Patient Instructions  Medication Instructions:  No changes *If you need a refill on your cardiac medications before your next appointment, please call your pharmacy*   Lab Work: None ordered If you have labs (blood work) drawn today and your tests are completely normal, you will receive your results only by: [place] (if you have MyChart) OR A paper copy in the mail If you have any lab test that is abnormal or we need to change your treatment, we will call you to review the results.   Testing/Procedures: None ordered   Follow-Up: At [facility], you and your health needs are our priority.  As part of our continuing mission to provide you with exceptional heart care, we have created designated Provider Care Teams.  These Care Teams include your primary Cardiologist (physician) and Advanced Practice Providers (APPs -  Physician Assistants and Nurse Practitioners) who all work together to provide you with the care you need, when you need it.  We recommend signing up for the patient portal called "MyChart".  Sign up information is provided on this After Visit Summary.  MyChart is used to connect with patients for Virtual Visits (Telemedicine).  Patients are able to view lab/test results, encounter notes, upcoming appointments, etc.  Non-urgent messages can be sent to your provider as well.   To learn more about what you can do with MyChart, go to  [email or web address].    Your next appointment:   12 month(s)  The format for your next appointment:   In Person  Provider:   Sanda Klein, MD {    Important Information About [place]         Signed,  Sanda Klein, MD  04/12/2022 2:11 PM    [facility] [address], [place], [place]  [address] Phone: [phone]; Fax: [phone]

## 2022-05-01 DIAGNOSIS — E782 Mixed hyperlipidemia: Secondary | ICD-10-CM | POA: Diagnosis not present

## 2022-05-01 DIAGNOSIS — I10 Essential (primary) hypertension: Secondary | ICD-10-CM | POA: Diagnosis not present

## 2022-05-01 DIAGNOSIS — K219 Gastro-esophageal reflux disease without esophagitis: Secondary | ICD-10-CM | POA: Diagnosis not present

## 2022-05-01 DIAGNOSIS — E039 Hypothyroidism, unspecified: Secondary | ICD-10-CM | POA: Diagnosis not present

## 2022-05-08 DIAGNOSIS — H353221 Exudative age-related macular degeneration, left eye, with active choroidal neovascularization: Secondary | ICD-10-CM | POA: Diagnosis not present

## 2022-05-22 ENCOUNTER — Other Ambulatory Visit: Payer: Self-pay | Admitting: Cardiovascular Disease

## 2022-06-06 DIAGNOSIS — H353221 Exudative age-related macular degeneration, left eye, with active choroidal neovascularization: Secondary | ICD-10-CM | POA: Diagnosis not present

## 2022-07-07 DIAGNOSIS — Z961 Presence of intraocular lens: Secondary | ICD-10-CM | POA: Diagnosis not present

## 2022-07-07 DIAGNOSIS — H353111 Nonexudative age-related macular degeneration, right eye, early dry stage: Secondary | ICD-10-CM | POA: Diagnosis not present

## 2022-07-07 DIAGNOSIS — H353221 Exudative age-related macular degeneration, left eye, with active choroidal neovascularization: Secondary | ICD-10-CM | POA: Diagnosis not present

## 2022-07-20 DIAGNOSIS — D509 Iron deficiency anemia, unspecified: Secondary | ICD-10-CM | POA: Diagnosis not present

## 2022-07-20 DIAGNOSIS — E782 Mixed hyperlipidemia: Secondary | ICD-10-CM | POA: Diagnosis not present

## 2022-07-20 DIAGNOSIS — R7301 Impaired fasting glucose: Secondary | ICD-10-CM | POA: Diagnosis not present

## 2022-07-20 DIAGNOSIS — E039 Hypothyroidism, unspecified: Secondary | ICD-10-CM | POA: Diagnosis not present

## 2022-07-27 DIAGNOSIS — Z23 Encounter for immunization: Secondary | ICD-10-CM | POA: Diagnosis not present

## 2022-07-27 DIAGNOSIS — Z0000 Encounter for general adult medical examination without abnormal findings: Secondary | ICD-10-CM | POA: Diagnosis not present

## 2022-07-27 DIAGNOSIS — I25118 Atherosclerotic heart disease of native coronary artery with other forms of angina pectoris: Secondary | ICD-10-CM | POA: Diagnosis not present

## 2022-07-27 DIAGNOSIS — D509 Iron deficiency anemia, unspecified: Secondary | ICD-10-CM | POA: Diagnosis not present

## 2022-07-27 DIAGNOSIS — H353221 Exudative age-related macular degeneration, left eye, with active choroidal neovascularization: Secondary | ICD-10-CM | POA: Diagnosis not present

## 2022-07-27 DIAGNOSIS — I10 Essential (primary) hypertension: Secondary | ICD-10-CM | POA: Diagnosis not present

## 2022-07-27 DIAGNOSIS — R7301 Impaired fasting glucose: Secondary | ICD-10-CM | POA: Diagnosis not present

## 2022-07-27 DIAGNOSIS — E039 Hypothyroidism, unspecified: Secondary | ICD-10-CM | POA: Diagnosis not present

## 2022-07-27 DIAGNOSIS — E440 Moderate protein-calorie malnutrition: Secondary | ICD-10-CM | POA: Diagnosis not present

## 2022-07-27 DIAGNOSIS — G3184 Mild cognitive impairment, so stated: Secondary | ICD-10-CM | POA: Diagnosis not present

## 2022-07-27 DIAGNOSIS — M1990 Unspecified osteoarthritis, unspecified site: Secondary | ICD-10-CM | POA: Diagnosis not present

## 2022-07-27 DIAGNOSIS — E782 Mixed hyperlipidemia: Secondary | ICD-10-CM | POA: Diagnosis not present

## 2022-08-04 DIAGNOSIS — H353221 Exudative age-related macular degeneration, left eye, with active choroidal neovascularization: Secondary | ICD-10-CM | POA: Diagnosis not present

## 2022-08-21 DIAGNOSIS — Z23 Encounter for immunization: Secondary | ICD-10-CM | POA: Diagnosis not present

## 2022-08-29 ENCOUNTER — Other Ambulatory Visit (HOSPITAL_COMMUNITY): Payer: Self-pay | Admitting: Family Medicine

## 2022-08-29 ENCOUNTER — Ambulatory Visit (HOSPITAL_COMMUNITY)
Admission: RE | Admit: 2022-08-29 | Discharge: 2022-08-29 | Disposition: A | Discharge status: Home / Self Care | Payer: Medicare Other | Source: Ambulatory Visit | Attending: Family Medicine | Admitting: Family Medicine

## 2022-08-29 DIAGNOSIS — M79672 Pain in left foot: Secondary | ICD-10-CM | POA: Diagnosis not present

## 2022-08-29 DIAGNOSIS — R609 Edema, unspecified: Secondary | ICD-10-CM | POA: Diagnosis not present

## 2022-08-29 DIAGNOSIS — M25572 Pain in left ankle and joints of left foot: Secondary | ICD-10-CM | POA: Diagnosis not present

## 2022-09-01 DIAGNOSIS — H353221 Exudative age-related macular degeneration, left eye, with active choroidal neovascularization: Secondary | ICD-10-CM | POA: Diagnosis not present

## 2022-09-08 DIAGNOSIS — S51011A Laceration without foreign body of right elbow, initial encounter: Secondary | ICD-10-CM | POA: Diagnosis not present

## 2022-09-08 DIAGNOSIS — M25561 Pain in right knee: Secondary | ICD-10-CM | POA: Diagnosis not present

## 2022-09-08 DIAGNOSIS — W19XXXA Unspecified fall, initial encounter: Secondary | ICD-10-CM | POA: Diagnosis not present

## 2022-09-15 DIAGNOSIS — M25561 Pain in right knee: Secondary | ICD-10-CM | POA: Diagnosis not present

## 2022-09-15 DIAGNOSIS — Z23 Encounter for immunization: Secondary | ICD-10-CM | POA: Diagnosis not present

## 2022-09-15 DIAGNOSIS — M1990 Unspecified osteoarthritis, unspecified site: Secondary | ICD-10-CM | POA: Diagnosis not present

## 2022-09-15 DIAGNOSIS — M79672 Pain in left foot: Secondary | ICD-10-CM | POA: Diagnosis not present

## 2022-09-15 DIAGNOSIS — S51011A Laceration without foreign body of right elbow, initial encounter: Secondary | ICD-10-CM | POA: Diagnosis not present

## 2022-09-29 DIAGNOSIS — H353221 Exudative age-related macular degeneration, left eye, with active choroidal neovascularization: Secondary | ICD-10-CM | POA: Diagnosis not present

## 2022-10-13 ENCOUNTER — Telehealth: Payer: Self-pay

## 2022-10-13 ENCOUNTER — Ambulatory Visit: Payer: Medicare Other | Admitting: Orthopedic Surgery

## 2022-10-13 ENCOUNTER — Encounter: Payer: Self-pay | Admitting: Orthopedic Surgery

## 2022-10-13 VITALS — BP 143/60 | HR 60 | Ht 62.0 in

## 2022-10-13 DIAGNOSIS — M25561 Pain in right knee: Secondary | ICD-10-CM | POA: Diagnosis not present

## 2022-10-13 DIAGNOSIS — R600 Localized edema: Secondary | ICD-10-CM

## 2022-10-13 DIAGNOSIS — M79672 Pain in left foot: Secondary | ICD-10-CM | POA: Diagnosis not present

## 2022-10-13 DIAGNOSIS — Z7182 Exercise counseling: Secondary | ICD-10-CM | POA: Diagnosis not present

## 2022-10-13 DIAGNOSIS — M1990 Unspecified osteoarthritis, unspecified site: Secondary | ICD-10-CM | POA: Diagnosis not present

## 2022-10-13 NOTE — Telephone Encounter (Signed)
Daughter stopped by office asking if they can get the pt compression stockings instead of using the rx given by Dr. Aline Brochure. States they tried to take to [place] but they don't supply them, and she would prefer not having to pt to [place]. Hope did let daughter know about [facility] in [place] again and she will try there but wanted to check alternatives as well.

## 2022-10-13 NOTE — Progress Notes (Signed)
Chief Complaint  Patient presents with   Foot Pain    Left foot pain, no fracture, DOI 32-   Dr. Nevada Crane has referred is a 89 year old female with bilateral foot and ankle pain  She did have a fall but had some pain in the left foot prior to the fall  She presents with soreness in her ankles difficulty standing up in the morning.  She did try Celebrex and tramadol it did not seem to help the pain  She did have imaging of the ankles and feet and she had some arthritis in the midfoot but it was mild     Physical Exam Constitutional:      General: She is not in acute distress.    Appearance: Normal appearance. She is normal weight. She is not ill-appearing, toxic-appearing or diaphoretic.  HENT:     Head: Normocephalic and atraumatic.     Nose: No congestion or rhinorrhea.  Eyes:     General: No scleral icterus.       Right eye: No discharge.        Left eye: No discharge.     Conjunctiva/sclera: Conjunctivae normal.  Cardiovascular:     Rate and Rhythm: Normal rate and regular rhythm.  Musculoskeletal:     Comments: Both feet and both legs are examined.  She has pitting edema both feet left worse than right and then swelling around the left ankle left worse than right some on the right.  Right definitely not as bad as the left  She is also tender around the Achilles tendons  The midfoot tenderness is not really in the bones in the soft tissue and she is tender up to the shafts of the tibia medially with pitting edema  Ankle range of motion and stability tests were normal there is no atrophy in the foot  Skin:    General: Skin is warm and dry.     Capillary Refill: Capillary refill takes less than 2 seconds.  Neurological:     General: No focal deficit present.     Mental Status: She is alert. Mental status is at baseline.     Sensory: No sensory deficit.     Gait: Gait abnormal.  Psychiatric:        Mood and Affect: Mood normal.    Imaging of the left foot and both  ankles  Left foot midfoot arthritis mild  Ankles no fracture dislocation or arthritis  Encounter Diagnosis  Name Primary?   Localized edema Yes   89 year old female with bilateral lower extremity edema and soft tissue inflammation related to the edema  The midfoot arthritis is not symptomatic.  Recommend compression stockings  Fluid pills as she is doing.  Adjust with medical doctor as needed  Follow-up as needed

## 2022-10-17 NOTE — Telephone Encounter (Signed)
They can get the compression stockings anywhere does not have to be [facility] [employer] Over the counter is fine too  I left detailed message

## 2022-10-27 DIAGNOSIS — H353221 Exudative age-related macular degeneration, left eye, with active choroidal neovascularization: Secondary | ICD-10-CM | POA: Diagnosis not present

## 2022-10-27 DIAGNOSIS — H353112 Nonexudative age-related macular degeneration, right eye, intermediate dry stage: Secondary | ICD-10-CM | POA: Diagnosis not present

## 2022-10-30 ENCOUNTER — Emergency Department (HOSPITAL_COMMUNITY)
Admission: EM | Admit: 2022-10-30 | Discharge: 2022-10-30 | Disposition: A | Discharge status: Home / Self Care | Payer: Medicare Other | Attending: Emergency Medicine | Admitting: Emergency Medicine

## 2022-10-30 ENCOUNTER — Other Ambulatory Visit: Payer: Self-pay

## 2022-10-30 DIAGNOSIS — S61411A Laceration without foreign body of right hand, initial encounter: Secondary | ICD-10-CM | POA: Insufficient documentation

## 2022-10-30 DIAGNOSIS — Z8544 Personal history of malignant neoplasm of other female genital organs: Secondary | ICD-10-CM | POA: Insufficient documentation

## 2022-10-30 DIAGNOSIS — Z951 Presence of aortocoronary bypass graft: Secondary | ICD-10-CM | POA: Insufficient documentation

## 2022-10-30 DIAGNOSIS — W548XXA Other contact with dog, initial encounter: Secondary | ICD-10-CM | POA: Diagnosis not present

## 2022-10-30 DIAGNOSIS — I10 Essential (primary) hypertension: Secondary | ICD-10-CM | POA: Diagnosis not present

## 2022-10-30 DIAGNOSIS — I2510 Atherosclerotic heart disease of native coronary artery without angina pectoris: Secondary | ICD-10-CM | POA: Insufficient documentation

## 2022-10-30 NOTE — ED Provider Notes (Signed)
[facility] Emergency Department Provider Note MRN:  [id number]  Arrival date & time: 10/30/22     Chief Complaint   Laceration   History of Present Illness   [name] is a 89 y.o. year-old female with a history of CAD presenting to the ED with chief complaint of laceration.  Laceration to the dorsum of the right hand.  Dog was jumping up wanting a treat and scratch the hand causing a skin tear.  No fall, no other injuries or complaints.  Does not use blood thinners.  Review of Systems  A thorough review of systems was obtained and all systems are negative except as noted in the HPI and PMH.   Patient's Health History    Past Medical History:  Diagnosis Date   CAD (coronary artery disease)    CABG 10/28/2005 LIMA to LAD,SVG to OM,SVG to distal RCA   Cancer ([place])    vaginal   Carotid atherosclerosis    minor   Coronary artery disease    Hyperlipidemia    Hypertension    Systemic hypertension     Past Surgical History:  Procedure Laterality Date   ABDOMINAL HYSTERECTOMY     ABDOMINAL HYSTERECTOMY  1966   CARDIAC CATHETERIZATION  10/27/2005   left main and severe 2 vessel disease.   CATARACT EXTRACTION  1998 & 2004   CESAREAN SECTION     CHOLECYSTECTOMY     CORONARY ARTERY BYPASS GRAFT     CORONARY ARTERY BYPASS GRAFT  10/28/2005   LIMA to LAD,SVG to OM,SVG to distal RCA   NM MYOCAR PERF WALL MOTION  01/11/2010   normal   US ECHOCARDIOGRAPHY  08/01/2011   mild to mod TR, ca+ AOV leaflets, AOV mildly sclerotic    Family History  Problem Relation Age of Onset   Stroke Father    Heart attack Sister    Heart attack Brother    Heart attack Brother    Cancer Brother    Cancer Brother    Diabetes Brother    Cancer Sister     Social History   Socioeconomic History   Marital status: Widowed    Spouse name: Not on file   Number of children: Not on file   Years of education: Not on file   Highest education level: Not on file   Occupational History   Not on file  Tobacco Use   Smoking status: Never   Smokeless tobacco: Never  Substance and Sexual Activity   Alcohol use: No   Drug use: No   Sexual activity: Not Currently  Other Topics Concern   Not on file  Social History Narrative   ** Merged History Encounter **       Social Determinants of Health   Financial Resource Strain: Not on file  Food Insecurity: Not on file  Transportation Needs: Not on file  Physical Activity: Not on file  Stress: Not on file  Social Connections: Not on file  Intimate Partner Violence: Not on file     Physical Exam   Vitals:   10/30/22 1935  BP: (!) 197/62  Pulse: 64  Resp: 16  Temp: 98.1 F (36.7 C)  SpO2: 100%    CONSTITUTIONAL: Well-appearing, NAD NEURO/PSYCH:  Alert and oriented x 3, no focal deficits EYES:  eyes equal and reactive ENT/NECK:  no LAD, no JVD CARDIO: Regular rate, well-perfused, normal S1 and S2 PULM:  CTAB no wheezing or rhonchi GI/GU:  non-distended, non-tender MSK/SPINE:  No  gross deformities, no edema SKIN: Flap laceration/skin tear to the dorsum of right hand, hemostatic   *Additional and/or pertinent findings included in MDM below  Diagnostic and Interventional Summary    EKG Interpretation  Date/Time:    Ventricular Rate:    PR Interval:    QRS Duration:   QT Interval:    QTC Calculation:   R Axis:     Text Interpretation:         Labs Reviewed - No data to display  No orders to display    Medications - No data to display   Procedures  /  Critical Care .Marland KitchenLaceration Repair  Date/Time: 10/30/2022 11:22 PM  Performed by: Maudie Flakes, MD Authorized by: Maudie Flakes, MD   Consent:    Consent obtained:  Verbal   Consent given by:  Patient   Risks, benefits, and alternatives were discussed: yes     Risks discussed:  Infection, need for additional repair, nerve damage, poor wound healing, poor cosmetic result, pain, retained foreign body, vascular damage  and tendon damage   Alternatives discussed:  No treatment Universal protocol:    Procedure explained and questions answered to patient or proxy's satisfaction: yes     Immediately prior to procedure, a time out was called: yes     Patient identity confirmed:  Verbally with patient Anesthesia:    Anesthesia method:  None Laceration details:    Location:  Hand   Hand location:  R hand, dorsum   Length (cm):  3   Depth (mm):  1 Pre-procedure details:    Preparation:  Patient was prepped and draped in usual sterile fashion Exploration:    Limited defect created (wound extended): no     Hemostasis achieved with:  Direct pressure   Wound exploration: wound explored through full range of motion and entire depth of wound visualized     Contaminated: no   Treatment:    Area cleansed with:  Saline   Amount of cleaning:  Standard Skin repair:    Repair method:  Steri-Strips and tissue adhesive   Number of Steri-Strips:  6 Approximation:    Approximation:  Close Repair type:    Repair type:  Simple Post-procedure details:    Dressing:  Open (no dressing)   Procedure completion:  Tolerated well, no immediate complications   ED Course and Medical Decision Making  Initial Impression and Ddx Very superficial laceration/skin tear to the dorsum of right hand.  Very fragile skin, sutures would likely cause more harm than good.  Will use Steri-Strips and Dermabond.  No other injuries.  The wound was thoroughly inspected, no contamination, thoroughly washed in the emergency department.  Past medical/surgical history that increases complexity of ED encounter: Advanced age  Interpretation of Diagnostics Laboratory and/or imaging options to aid in the diagnosis/care of the patient were considered.  After careful history and physical examination, it was determined that there was no indication for diagnostics at this time.  Patient Reassessment and Ultimate Disposition/Management      Discharge  Patient management required discussion with the following services or consulting groups:  None  Complexity of Problems Addressed Acute complicated illness or Injury  Additional Data Reviewed and Analyzed Further history obtained from: Further history from spouse/family member  Additional Factors Impacting ED Encounter Risk Minor Procedures  Barth Kirks. [place] Small, [email or web address]  Final Clinical Impressions(s) / ED Diagnoses     ICD-10-CM   1. Laceration of  right hand without foreign body, initial encounter  [phone]       ED Discharge Orders     None        Discharge Instructions Discussed with and Provided to Patient:    Discharge Instructions      You were evaluated in the Emergency Department and after careful evaluation, we did not find any emergent condition requiring admission or further testing in the hospital.  Your exam/testing today was overall reassuring.  We repaired your laceration/skin tear here in the emergency department with Steri-Strips and medical adhesive.  Try to keep the area dry, dry it quickly if it gets wet.  Please return to the Emergency Department if you experience any worsening of your condition.  Thank you for allowing [place] to be a part of your care.       Maudie Flakes, MD 10/30/22 [phone]

## 2022-10-30 NOTE — ED Triage Notes (Signed)
Pt with lac to top of right hand after pt's dog's nail caught pt's hand, dog was jumping.

## 2022-10-30 NOTE — Discharge Instructions (Signed)
You were evaluated in the Emergency Department and after careful evaluation, we did not find any emergent condition requiring admission or further testing in the hospital.  Your exam/testing today was overall reassuring.  We repaired your laceration/skin tear here in the emergency department with Steri-Strips and medical adhesive.  Try to keep the area dry, dry it quickly if it gets wet.  Please return to the Emergency Department if you experience any worsening of your condition.  Thank you for allowing [place] to be a part of your care.

## 2022-11-03 ENCOUNTER — Telehealth: Payer: Self-pay

## 2022-11-03 NOTE — Telephone Encounter (Signed)
        Patient  visited [facility] on 10/30/2022  for Laceration of right hand without foreign body, initial encounter.   Telephone encounter attempt :  1st  A HIPAA compliant voice message was left requesting a return call.  Instructed patient to call back at [phone].   [place] Resource Care Guide   [email or web address]  ?? [phone]   Website: [email or web address]  [email or web address]

## 2022-11-07 ENCOUNTER — Telehealth: Payer: Self-pay

## 2022-11-07 NOTE — Telephone Encounter (Signed)
        Patient  visited [facility] on 10/30/2022  for Laceration of right hand without foreign body, initial encounter.   Telephone encounter attempt :   2nd A HIPAA compliant voice message was left requesting a return call.  Instructed patient to call back at [phone].   [place] Resource Care Guide   [email or web address]  ?? [phone]   Website: [email or web address]  [email or web address]

## 2022-11-09 ENCOUNTER — Telehealth: Payer: Self-pay

## 2022-11-09 NOTE — Telephone Encounter (Signed)
     Patient  visit on 10/30/2022  at [facility] was for Laceration of right hand without foreign body, initial encounter.  Have you been able to follow up with your primary care physician? Patient has appointment next week.  The patient was or was not able to obtain any needed medicine or equipment. No medication prescribed.  Are there diet recommendations that you are having difficulty following? No  Patient expresses understanding of discharge instructions and education provided has no other needs at this time. Yes   [place] Resource Care Guide   [email or web address]  ?? [phone]   Website: [email or web address]

## 2022-11-23 DIAGNOSIS — R7301 Impaired fasting glucose: Secondary | ICD-10-CM | POA: Diagnosis not present

## 2022-11-23 DIAGNOSIS — D509 Iron deficiency anemia, unspecified: Secondary | ICD-10-CM | POA: Diagnosis not present

## 2022-11-23 DIAGNOSIS — E782 Mixed hyperlipidemia: Secondary | ICD-10-CM | POA: Diagnosis not present

## 2022-11-23 DIAGNOSIS — E039 Hypothyroidism, unspecified: Secondary | ICD-10-CM | POA: Diagnosis not present

## 2022-11-24 DIAGNOSIS — H353221 Exudative age-related macular degeneration, left eye, with active choroidal neovascularization: Secondary | ICD-10-CM | POA: Diagnosis not present

## 2022-11-30 ENCOUNTER — Encounter: Payer: Self-pay | Admitting: Radiology

## 2022-11-30 DIAGNOSIS — E039 Hypothyroidism, unspecified: Secondary | ICD-10-CM | POA: Diagnosis not present

## 2022-11-30 DIAGNOSIS — M1990 Unspecified osteoarthritis, unspecified site: Secondary | ICD-10-CM | POA: Diagnosis not present

## 2022-11-30 DIAGNOSIS — M19072 Primary osteoarthritis, left ankle and foot: Secondary | ICD-10-CM | POA: Diagnosis not present

## 2022-11-30 DIAGNOSIS — M4696 Unspecified inflammatory spondylopathy, lumbar region: Secondary | ICD-10-CM | POA: Diagnosis not present

## 2022-11-30 DIAGNOSIS — M1712 Unilateral primary osteoarthritis, left knee: Secondary | ICD-10-CM | POA: Diagnosis not present

## 2022-11-30 DIAGNOSIS — R600 Localized edema: Secondary | ICD-10-CM | POA: Diagnosis not present

## 2022-11-30 DIAGNOSIS — M25561 Pain in right knee: Secondary | ICD-10-CM | POA: Diagnosis not present

## 2022-11-30 DIAGNOSIS — M79672 Pain in left foot: Secondary | ICD-10-CM | POA: Diagnosis not present

## 2022-11-30 DIAGNOSIS — M19019 Primary osteoarthritis, unspecified shoulder: Secondary | ICD-10-CM | POA: Diagnosis not present

## 2022-11-30 DIAGNOSIS — E782 Mixed hyperlipidemia: Secondary | ICD-10-CM | POA: Diagnosis not present

## 2022-11-30 DIAGNOSIS — I10 Essential (primary) hypertension: Secondary | ICD-10-CM | POA: Diagnosis not present

## 2022-12-29 DIAGNOSIS — H353221 Exudative age-related macular degeneration, left eye, with active choroidal neovascularization: Secondary | ICD-10-CM | POA: Diagnosis not present

## 2023-01-04 DIAGNOSIS — H524 Presbyopia: Secondary | ICD-10-CM | POA: Diagnosis not present

## 2023-01-04 DIAGNOSIS — H353221 Exudative age-related macular degeneration, left eye, with active choroidal neovascularization: Secondary | ICD-10-CM | POA: Diagnosis not present

## 2023-03-02 DIAGNOSIS — H353221 Exudative age-related macular degeneration, left eye, with active choroidal neovascularization: Secondary | ICD-10-CM | POA: Diagnosis not present

## 2023-04-13 DIAGNOSIS — H353221 Exudative age-related macular degeneration, left eye, with active choroidal neovascularization: Secondary | ICD-10-CM | POA: Diagnosis not present

## 2023-04-13 DIAGNOSIS — H353112 Nonexudative age-related macular degeneration, right eye, intermediate dry stage: Secondary | ICD-10-CM | POA: Diagnosis not present

## 2023-04-16 DIAGNOSIS — U071 COVID-19: Secondary | ICD-10-CM | POA: Diagnosis not present

## 2023-04-19 ENCOUNTER — Other Ambulatory Visit: Payer: Self-pay | Admitting: Cardiovascular Disease

## 2023-04-24 ENCOUNTER — Encounter (HOSPITAL_COMMUNITY): Payer: Self-pay

## 2023-04-24 ENCOUNTER — Other Ambulatory Visit: Payer: Self-pay

## 2023-04-24 ENCOUNTER — Emergency Department (HOSPITAL_COMMUNITY)
Admission: EM | Admit: 2023-04-24 | Discharge: 2023-04-24 | Disposition: A | Discharge status: Home / Self Care | Payer: Medicare Other | Attending: Emergency Medicine | Admitting: Emergency Medicine

## 2023-04-24 DIAGNOSIS — R42 Dizziness and giddiness: Secondary | ICD-10-CM | POA: Diagnosis present

## 2023-04-24 DIAGNOSIS — R6889 Other general symptoms and signs: Secondary | ICD-10-CM | POA: Diagnosis not present

## 2023-04-24 DIAGNOSIS — R55 Syncope and collapse: Secondary | ICD-10-CM | POA: Diagnosis not present

## 2023-04-24 DIAGNOSIS — U099 Post covid-19 condition, unspecified: Secondary | ICD-10-CM | POA: Diagnosis not present

## 2023-04-24 DIAGNOSIS — U071 COVID-19: Secondary | ICD-10-CM | POA: Diagnosis not present

## 2023-04-24 DIAGNOSIS — R531 Weakness: Secondary | ICD-10-CM | POA: Diagnosis not present

## 2023-04-24 DIAGNOSIS — R404 Transient alteration of awareness: Secondary | ICD-10-CM | POA: Diagnosis not present

## 2023-04-24 DIAGNOSIS — Z743 Need for continuous supervision: Secondary | ICD-10-CM | POA: Diagnosis not present

## 2023-04-24 DIAGNOSIS — Z7982 Long term (current) use of aspirin: Secondary | ICD-10-CM | POA: Insufficient documentation

## 2023-04-24 LAB — COMPREHENSIVE METABOLIC PANEL
ALT: 16 U/L (ref 0–44)
AST: 28 U/L (ref 15–41)
Albumin: 3.5 g/dL (ref 3.5–5.0)
Alkaline Phosphatase: 54 U/L (ref 38–126)
Anion gap: 10 (ref 5–15)
BUN: 23 mg/dL (ref 8–23)
CO2: 25 mmol/L (ref 22–32)
Calcium: 9 mg/dL (ref 8.9–10.3)
Chloride: 102 mmol/L (ref 98–111)
Creatinine, Ser: 0.96 mg/dL (ref 0.44–1.00)
GFR, Estimated: 54 mL/min — ABNORMAL LOW (ref >60)
Glucose, Bld: 167 mg/dL — ABNORMAL HIGH (ref 70–99)
Potassium: 4.3 mmol/L (ref 3.5–5.1)
Sodium: 137 mmol/L (ref 135–145)
Total Bilirubin: 0.7 mg/dL (ref 0.3–1.2)
Total Protein: 6.3 g/dL — ABNORMAL LOW (ref 6.5–8.1)

## 2023-04-24 LAB — URINALYSIS, ROUTINE W REFLEX MICROSCOPIC
Bilirubin Urine: NEGATIVE
Glucose, UA: NEGATIVE mg/dL
Hgb urine dipstick: NEGATIVE
Ketones, ur: NEGATIVE mg/dL
Leukocytes,Ua: NEGATIVE
Nitrite: NEGATIVE
Protein, ur: NEGATIVE mg/dL
Specific Gravity, Urine: 1.014 (ref 1.005–1.030)
pH: 5 (ref 5.0–8.0)

## 2023-04-24 LAB — CBC
HCT: 32.6 % — ABNORMAL LOW (ref 36.0–46.0)
Hemoglobin: 10.2 g/dL — ABNORMAL LOW (ref 12.0–15.0)
MCH: 31 pg (ref 26.0–34.0)
MCHC: 31.3 g/dL (ref 30.0–36.0)
MCV: 99.1 fL (ref 80.0–100.0)
Platelets: 251 10*3/uL (ref 150–400)
RBC: 3.29 MIL/uL — ABNORMAL LOW (ref 3.87–5.11)
RDW: 13.2 % (ref 11.5–15.5)
WBC: 7 10*3/uL (ref 4.0–10.5)
nRBC: 0 % (ref 0.0–0.2)

## 2023-04-24 LAB — SARS CORONAVIRUS 2 BY RT PCR: SARS Coronavirus 2 by RT PCR: POSITIVE — AB

## 2023-04-24 MED ORDER — GUAIFENESIN-DM 100-10 MG/5ML PO SYRP
10.0000 mL | ORAL_SOLUTION | Freq: Once | ORAL | Status: AC
  Administered 2023-04-24: 10 mL via ORAL
  Filled 2023-04-24: qty 10

## 2023-04-24 NOTE — ED Notes (Signed)
Straight cath for urine [place] NT at bedside

## 2023-04-24 NOTE — ED Provider Notes (Addendum)
[place] [facility] AT [facility] Provider Note   CSN: [id number] Arrival date & time: 04/24/23  1141     History  Chief Complaint  Patient presents with  . Loss of Consciousness    [name] is a 89 y.o. female.  Pt with reported two syncopal events in past 3-4 days. Both times sitting in chair, slumped in chair, not responsive. No seizure activity noted. Pt limited historian, level 5 caveat. No reported fall from chair. Pt denies specific c/o. No headache. No chest pain or discomfort. No sob. No abd pain or nvd. No dysuria. No extremity pain or injury. No reported blood loss, rectal bleeding or melena. Pt unaware of any change in meds. No report of fevers.   The history is provided by the patient, medical records, the EMS personnel and a relative. The history is limited by the condition of the patient.  Loss of Consciousness Associated symptoms: no chest pain, no fever, no headaches, no shortness of breath, no vomiting and no weakness        Home Medications Prior to Admission medications   Medication Sig Start Date End Date Taking? Authorizing Provider  acetaminophen (TYLENOL) 650 MG CR tablet Take 650 mg by mouth at bedtime. Arthritis Pain    [provider]  amLODipine (NORVASC) 2.5 MG tablet Take 2.5 mg by mouth daily. 08/23/20   [provider]  aspirin 81 MG chewable tablet Chew 81 mg by mouth at bedtime.    [provider]  celecoxib (CELEBREX) 100 MG capsule Take 100 mg by mouth daily.    [provider]  DEXILANT 60 MG capsule Take 60 mg by mouth daily.  09/06/13   [provider]  furosemide (LASIX) 20 MG tablet Take 1 tablet by mouth every morning. 02/10/19   [provider]  Homeopathic Products (LEG CRAMPS PO) Take 2 capsules by mouth at bedtime.    [provider]  isosorbide dinitrate (ISORDIL) 10 MG tablet TAKE ONE TABLET BY MOUTH TWICE A DAY 09/28/20   Croitoru, Mihai, MD   levothyroxine (SYNTHROID, LEVOTHROID) 50 MCG tablet Take 50 mcg by mouth daily before breakfast.  11/16/17   [provider]  losartan (COZAAR) 50 MG tablet TAKE ONE TABLET (50MG  TOTAL) BY MOUTH EVERY EVENING 04/19/23   Croitoru, Mihai, MD  lovastatin (MEVACOR) 20 MG tablet Take 20 mg by mouth at bedtime. 11/11/19   [provider]  metoprolol tartrate (LOPRESSOR) 25 MG tablet TAKE ONE TABLET (25MG  TOTAL) BY MOUTH TWO TIMES DAILY. TAKE MEDICATION AS CLOSE AS POSSIBLE TO 12 HOURS APART. 04/19/23   Croitoru, Mihai, MD  Multiple Vitamin (MULTIVITAMIN WITH MINERALS) TABS tablet Take 1 tablet by mouth daily.    [provider]  Multiple Vitamins-Minerals (PRESERVISION AREDS 2 PO) Take 1 tablet by mouth 2 (two) times daily.    [provider]  nitroGLYCERIN (NITROSTAT) 0.4 MG SL tablet Place 1 tablet (0.4 mg total) under the tongue every 5 (five) minutes as needed. 04/12/22   Croitoru, Mihai, MD  potassium chloride (KLOR-CON) 10 MEQ tablet Take 10 mEq by mouth daily. With lasix dose 08/08/19   [provider]  traMADol (ULTRAM) 50 MG tablet Take 50 mg by mouth every 12 (twelve) hours as needed.    [provider]      Allergies    Lipitor [atorvastatin], Aspirin, Celecoxib, Levaquin [levofloxacin in d5w], Sulfa antibiotics, and Sulfonamide derivatives    Review of Systems   Review of Systems  Constitutional:  Negative for fever.  Respiratory:  Negative for cough and shortness of breath.   Cardiovascular:  Positive for syncope. Negative for chest pain.  Gastrointestinal:  Negative for abdominal pain, blood in stool and vomiting.  Genitourinary:  Negative for dysuria and flank pain.  Neurological:  Negative for weakness, numbness and headaches.    Physical Exam Updated Vital Signs BP (!) 156/48 (BP Location: Right Arm)   Pulse (!) 57   Temp 98 F (36.7 C) (Oral)   Ht 1.575 m (5\' 2" )   Wt 46.3 kg   SpO2 98%   BMI 18.66 kg/m  Physical  Exam Vitals and nursing note reviewed.  Constitutional:      Appearance: Normal appearance. She is well-developed.  HENT:     Head: Atraumatic.     Nose: Nose normal.     Mouth/Throat:     Mouth: Mucous membranes are moist.  Eyes:     General: No scleral icterus.    Conjunctiva/sclera: Conjunctivae normal.     Pupils: Pupils are equal, round, and reactive to light.  Neck:     Vascular: No carotid bruit.     Trachea: No tracheal deviation.     Comments: No stiffness or rigidity.  Cardiovascular:     Rate and Rhythm: Normal rate and regular rhythm.     Pulses: Normal pulses.     Heart sounds: Normal heart sounds. No murmur heard.    No friction rub. No gallop.  Pulmonary:     Effort: Pulmonary effort is normal. No respiratory distress.     Breath sounds: Normal breath sounds.  Abdominal:     General: Bowel sounds are normal. There is no distension.     Palpations: Abdomen is soft. There is no mass.     Tenderness: There is no abdominal tenderness. There is no guarding.     Comments: No pulsatile mass.   Genitourinary:    Comments: No cva tenderness.  Musculoskeletal:        General: No swelling or tenderness.     Cervical back: Normal range of motion and neck supple. No rigidity. No muscular tenderness.     Right lower leg: No edema.     Left lower leg: No edema.     Comments: CTLS spine, non tender, aligned, no step off. Good rom bil extremities without pain or focal bony tenderness.   Skin:    General: Skin is warm and dry.     Findings: No rash.  Neurological:     Mental Status: She is alert.     Comments: Alert, speech normal. +HOH. Motor/sens grossly intact bil.   Psychiatric:        Mood and Affect: Mood normal.    ED Results / Procedures / Treatments   Labs (all labs ordered are listed, but only abnormal results are displayed) Results for orders placed or performed during the hospital encounter of 04/24/23  SARS Coronavirus 2 by RT PCR (hospital order,  performed in [facility] hospital lab) *cepheid single result test* Anterior Nasal Swab   Specimen: Anterior Nasal Swab  Result Value Ref Range   SARS Coronavirus 2 by RT PCR POSITIVE (A) NEGATIVE  CBC  Result Value Ref Range   WBC 7.0 4.0 - 10.5 K/uL   RBC 3.29 (L) 3.87 - 5.11 MIL/uL   Hemoglobin 10.2 (L) 12.0 - 15.0 g/dL   HCT 38.7 (L) 56.4 - 33.2 %   MCV 99.1 80.0 - 100.0 fL   MCH 31.0  26.0 - 34.0 pg   MCHC 31.3 30.0 - 36.0 g/dL   RDW 40.9 81.1 - 91.4 %   Platelets 251 150 - 400 K/uL   nRBC 0.0 0.0 - 0.2 %  Comprehensive metabolic panel  Result Value Ref Range   Sodium 137 135 - 145 mmol/L   Potassium 4.3 3.5 - 5.1 mmol/L   Chloride 102 98 - 111 mmol/L   CO2 25 22 - 32 mmol/L   Glucose, Bld 167 (H) 70 - 99 mg/dL   BUN 23 8 - 23 mg/dL   Creatinine, Ser 7.82 0.44 - 1.00 mg/dL   Calcium 9.0 8.9 - 95.6 mg/dL   Total Protein 6.3 (L) 6.5 - 8.1 g/dL   Albumin 3.5 3.5 - 5.0 g/dL   AST 28 15 - 41 U/L   ALT 16 0 - 44 U/L   Alkaline Phosphatase 54 38 - 126 U/L   Total Bilirubin 0.7 0.3 - 1.2 mg/dL   GFR, Estimated 54 (L) >60 mL/min   Anion gap 10 5 - 15  Urinalysis, Routine w reflex microscopic -Urine, Catheterized  Result Value Ref Range   Color, Urine YELLOW YELLOW   APPearance CLEAR CLEAR   Specific Gravity, Urine 1.014 1.005 - 1.030   pH 5.0 5.0 - 8.0   Glucose, UA NEGATIVE NEGATIVE mg/dL   Hgb urine dipstick NEGATIVE NEGATIVE   Bilirubin Urine NEGATIVE NEGATIVE   Ketones, ur NEGATIVE NEGATIVE mg/dL   Protein, ur NEGATIVE NEGATIVE mg/dL   Nitrite NEGATIVE NEGATIVE   Leukocytes,Ua NEGATIVE NEGATIVE    EKG EKG Interpretation Date/Time:  Tuesday April 24 2023 11:54:30 EDT Ventricular Rate:  58 PR Interval:  189 QRS Duration:  145 QT Interval:  607 QTC Calculation: 597 R Axis:   82  Text Interpretation: Sinus rhythm Right bundle branch block No significant change since last tracing Confirmed by Cathren Laine ([id number]) on 04/24/2023 12:01:58 PM  Radiology No results  found.  Procedures Procedures    Medications Ordered in ED Medications - No data to display  ED Course/ Medical Decision Making/ A&P                             Medical Decision Making Problems Addressed: COVID-19 virus infection: acute illness or injury with systemic symptoms that poses a threat to life or bodily functions Unresponsive episode: acute illness or injury with systemic symptoms that poses a threat to life or bodily functions  Amount and/or Complexity of Data Reviewed Independent Historian: EMS    Details: hx External Data Reviewed: notes. Labs: ordered. Decision-making details documented in ED Course. ECG/medicine tests: ordered and independent interpretation performed. Decision-making details documented in ED Course.  Risk OTC drugs. Decision regarding hospitalization.   Iv ns. Continuous pulse ox and cardiac monitoring. Labs ordered/sent.   Differential diagnosis includes syncope, anemia, dehydration, etc. Dispo decision including potential need for admission considered - will get labs and reassess.   Reviewed nursing notes and prior charts for additional history. External reports reviewed. Additional history from: ems, family.   Cardiac monitor: sinus rhythm, rate 60.  Labs reviewed/interpreted by me - wbc 7, hct 33. Covid is positive. Ua neg for uti. Cr normal.   Pt is breathing comfortably, no increased wob, pulse ox 98%.  Family present, feels likely that patient had fallen asleep in chair as opposed to syncope - that does appear c/w history provided.   Po fluids/food.   Pt fully awake and alert. Content  appearing. No resp distress.   Currently pt appears stable for d/c.   Return precautions provided.              Final Clinical Impression(s) / ED Diagnoses Final diagnoses:  None    Rx / DC Orders ED Discharge Orders     None         Cathren Laine, MD 04/24/23 1409

## 2023-04-24 NOTE — Discharge Instructions (Addendum)
It was our pleasure to provide your ER care today - we hope that you feel better. Your covid test is positive. Drink plenty of fluids/stay well hydrated. Get adequate nutrition - consider supplementing nutrition with Boost, Ensure or other nutritious shake.   You may try nyquil, mucinex, robitussin or similar over the counter cold and flu medication for symptom relief.   Fall precautions.   Follow up with primary care doctor in two weeks if symptoms fail to improve/resolve.  Return to ER if worse, new symptoms, increased trouble breathing, or other emergency concern.

## 2023-04-24 NOTE — ED Triage Notes (Signed)
Pt came from home by EMS Syncopal episode today, out for 5 mins Syncopal episode 3 days ago out for 2 mins.  Pt did not fall. Was witnessed by family, pt slumped over in chair   EMS BGL 136 BP 135/40 HR in 50's  Pt is alert and complaining of LEFT sided neck pain

## 2023-04-30 ENCOUNTER — Telehealth: Payer: Self-pay

## 2023-04-30 NOTE — Telephone Encounter (Signed)
Transition Care Management Unsuccessful Follow-up Telephone Call  Date of discharge and from where:  04/24/2023 [facility]  Attempts:  1st Attempt  Reason for unsuccessful TCM follow-up call:  Left voice message   [facility]  [facility] Population Health Community Resource Care Guide   [email or web address]  ?? [phone]   Website: [email or web address]  [email or web address]

## 2023-05-01 ENCOUNTER — Telehealth: Payer: Self-pay

## 2023-05-01 NOTE — Telephone Encounter (Signed)
Transition Care Management Unsuccessful Follow-up Telephone Call  Date of discharge and from where:  04/24/2023 [facility]  Attempts:  2nd Attempt  Reason for unsuccessful TCM follow-up call:  Left voice message   [facility]  [facility] Population Health Community Resource Care Guide   [email or web address]  ?? [phone]   Website: [email or web address]  [email or web address]

## 2023-05-08 DIAGNOSIS — E440 Moderate protein-calorie malnutrition: Secondary | ICD-10-CM | POA: Diagnosis not present

## 2023-05-08 DIAGNOSIS — Z79899 Other long term (current) drug therapy: Secondary | ICD-10-CM | POA: Diagnosis not present

## 2023-05-08 DIAGNOSIS — R55 Syncope and collapse: Secondary | ICD-10-CM | POA: Diagnosis not present

## 2023-05-08 DIAGNOSIS — Z713 Dietary counseling and surveillance: Secondary | ICD-10-CM | POA: Diagnosis not present

## 2023-05-08 DIAGNOSIS — U099 Post covid-19 condition, unspecified: Secondary | ICD-10-CM | POA: Diagnosis not present

## 2023-05-08 DIAGNOSIS — G3184 Mild cognitive impairment, so stated: Secondary | ICD-10-CM | POA: Diagnosis not present

## 2023-05-08 DIAGNOSIS — I25118 Atherosclerotic heart disease of native coronary artery with other forms of angina pectoris: Secondary | ICD-10-CM | POA: Diagnosis not present

## 2023-05-09 ENCOUNTER — Ambulatory Visit: Payer: Medicare Other | Admitting: Cardiovascular Disease

## 2023-05-21 ENCOUNTER — Other Ambulatory Visit: Payer: Self-pay | Admitting: Cardiovascular Disease

## 2023-05-25 DIAGNOSIS — R7301 Impaired fasting glucose: Secondary | ICD-10-CM | POA: Diagnosis not present

## 2023-05-25 DIAGNOSIS — E039 Hypothyroidism, unspecified: Secondary | ICD-10-CM | POA: Diagnosis not present

## 2023-05-25 DIAGNOSIS — E782 Mixed hyperlipidemia: Secondary | ICD-10-CM | POA: Diagnosis not present

## 2023-05-31 DIAGNOSIS — E440 Moderate protein-calorie malnutrition: Secondary | ICD-10-CM | POA: Diagnosis not present

## 2023-05-31 DIAGNOSIS — R600 Localized edema: Secondary | ICD-10-CM | POA: Diagnosis not present

## 2023-05-31 DIAGNOSIS — I10 Essential (primary) hypertension: Secondary | ICD-10-CM | POA: Diagnosis not present

## 2023-05-31 DIAGNOSIS — E782 Mixed hyperlipidemia: Secondary | ICD-10-CM | POA: Diagnosis not present

## 2023-05-31 DIAGNOSIS — M1711 Unilateral primary osteoarthritis, right knee: Secondary | ICD-10-CM | POA: Diagnosis not present

## 2023-05-31 DIAGNOSIS — I25118 Atherosclerotic heart disease of native coronary artery with other forms of angina pectoris: Secondary | ICD-10-CM | POA: Diagnosis not present

## 2023-05-31 DIAGNOSIS — E039 Hypothyroidism, unspecified: Secondary | ICD-10-CM | POA: Diagnosis not present

## 2023-05-31 DIAGNOSIS — D509 Iron deficiency anemia, unspecified: Secondary | ICD-10-CM | POA: Diagnosis not present

## 2023-05-31 DIAGNOSIS — G3184 Mild cognitive impairment, so stated: Secondary | ICD-10-CM | POA: Diagnosis not present

## 2023-05-31 DIAGNOSIS — M79672 Pain in left foot: Secondary | ICD-10-CM | POA: Diagnosis not present

## 2023-05-31 DIAGNOSIS — H353221 Exudative age-related macular degeneration, left eye, with active choroidal neovascularization: Secondary | ICD-10-CM | POA: Diagnosis not present

## 2023-06-01 DIAGNOSIS — H353221 Exudative age-related macular degeneration, left eye, with active choroidal neovascularization: Secondary | ICD-10-CM | POA: Diagnosis not present

## 2023-06-28 NOTE — Progress Notes (Signed)
Cardiology Clinic Note   Date: 07/02/2023 ID: Bobbiejean, Mafi 10/11/1924, MRN [id number]  Primary Cardiologist:  Thurmon Fair, MD  Patient Profile    [name] is a 89 y.o. female who presents to the clinic today for routine follow up.     Past medical history significant for: CAD. CABG x 3 10/28/2005: LIMA to LAD, SVG to OM, SVG to distal RCA. Nuclear stress test 08/14/2014: Low risk study with small, moderate intensity, fixed distal anterior and apical defect consistent with soft tissue attenuation, no ischemia. RBBB. Hypertension. Hyperlipidemia. Lipid panel 05/25/2023: LDL 59, HDL 57, TG 74, total 131. Migraines. GERD.     History of Present Illness    Cydnie L Dauphin is a longtime patient of cardiology.  She is followed by Dr. Royann Shivers for the above outlined history.  In summary, patient underwent CABG x 3 in January 2007.  She has not had new series coronary events since.  Patient was last seen in the office by Dr. Royann Shivers on 04/12/2022 for routine follow-up.  She reported occasional chest discomfort requiring 1 NTG with resolution.  These episodes occur less than once a month.  She reported occasional orthostatic dizziness but no falls or syncope.  She was found to have difference in blood pressure between right and left upper extremity (lower on the right by 20 mmHg).  She denied symptoms of upper extremity claudication or vertebral steal syndrome.  No medication changes were made.  Patient presented to the ED on 04/24/2023 with syncopal episodes x 2.  Per ED note, patient reported 2 syncopal events in the prior 3 to 4 days.  Both occurred while sitting in a chair when she slumped over and was not responsive.  No seizure activity was noted.  No fall from chair.  Patient's family reported they felt she had fallen asleep in the chair versus syncope.  CBC and CMP were unremarkable, urinalysis was negative.  She was found to be positive for COVID.  Today, patient is accompanied  by her daughter. She is doing well since recovering from Covid. Patient has baseline shortness of breath secondary to her bent over posture. She reports chronic lower extremity edema that is normal in the morning and progresses throughout the day. She manages it with compression stockings and Lasix. No chest pain, pressure, or tightness. No palpitations.  She stays active walking laps in her home. Her son measured it and it equals 1/4 miles. She tries to do this every day. She will do some walking outside but because of the uneven ground she limits how much she walks outside.     ROS: All other systems reviewed and are otherwise negative except as noted in History of Present Illness.  Studies Reviewed    EKG is not ordered today.       Physical Exam    VS:  BP (!) 120/50 (BP Location: Left Arm, Patient Position: Sitting, Cuff Size: Normal)   Pulse (!) 58   Ht 5\' 2"  (1.575 m)   Wt 101 lb (45.8 kg)   SpO2 98%   BMI 18.47 kg/m  , BMI Body mass index is 18.47 kg/m.  GEN: Well nourished, well developed, in no acute distress. Neck: No JVD or carotid bruits. Cardiac:  RRR. No murmurs. No rubs or gallops.   Respiratory:  Respirations regular and unlabored. Clear to auscultation without rales, wheezing or rhonchi. GI: Soft, nontender, nondistended. Extremities: Radials/DP/PT 2+ and equal bilaterally. No clubbing or cyanosis. No edema. Compression  stockings in place.   Skin: Warm and dry, no rash. Neuro: Strength intact.  Assessment & Plan    CAD.  S/p CABG x 04 October 2005. Patient denies chest pain, pressure or tightness. Continue aspirin, amlodipine, isosorbide, metoprolol, as needed SL NTG. Hypertension. BP today 120/50. Patient denies headaches, dizziness or vision changes. Continue amlodipine, losartan. Hyperlipidemia. LDL 59 August 2024. Continue Lovastatin.  Chronic lower extremity edema. Patient reports lower extremities are normal in the morning and progress throughout the day.  No orthopnea or PND. It is well managed with compression socks and Lasix. No edema on exam today. Continue Lasix and compression stockings.   Disposition: Return in 1 year or sooner as needed.          Signed, Etta Grandchild. Messiah Rovira, DNP, NP-C

## 2023-07-02 ENCOUNTER — Ambulatory Visit: Payer: Medicare Other | Attending: Cardiovascular Disease | Admitting: Student

## 2023-07-02 ENCOUNTER — Encounter: Payer: Self-pay | Admitting: Student

## 2023-07-02 VITALS — BP 120/50 | HR 58 | Ht 62.0 in | Wt 101.0 lb

## 2023-07-02 DIAGNOSIS — I2510 Atherosclerotic heart disease of native coronary artery without angina pectoris: Secondary | ICD-10-CM

## 2023-07-02 DIAGNOSIS — E785 Hyperlipidemia, unspecified: Secondary | ICD-10-CM | POA: Diagnosis not present

## 2023-07-02 DIAGNOSIS — R600 Localized edema: Secondary | ICD-10-CM | POA: Diagnosis not present

## 2023-07-02 DIAGNOSIS — I10 Essential (primary) hypertension: Secondary | ICD-10-CM

## 2023-07-02 NOTE — Patient Instructions (Signed)
Medication Instructions:  Your physician recommends that you continue on your current medications as directed. Please refer to the Current Medication list given to you today.    *If you need a refill on your cardiac medications before your next appointment, please call your pharmacy*     Follow-Up: At [facility], you and your health needs are our priority.  As part of our continuing mission to provide you with exceptional heart care, we have created designated Provider Care Teams.  These Care Teams include your primary Cardiologist (physician) and Advanced Practice Providers (APPs -  Physician Assistants and Nurse Practitioners) who all work together to provide you with the care you need, when you need it.  We recommend signing up for the patient portal called "MyChart".  Sign up information is provided on this After Visit Summary.  MyChart is used to connect with patients for Virtual Visits (Telemedicine).  Patients are able to view lab/test results, encounter notes, upcoming appointments, etc.  Non-urgent messages can be sent to your provider as well.   To learn more about what you can do with MyChart, go to [email or web address].    Your next appointment:   1 year(s)  The format for your next appointment:   In Person  Provider:   Thurmon Fair, MD

## 2023-07-13 DIAGNOSIS — H353221 Exudative age-related macular degeneration, left eye, with active choroidal neovascularization: Secondary | ICD-10-CM | POA: Diagnosis not present

## 2023-07-19 DIAGNOSIS — Z23 Encounter for immunization: Secondary | ICD-10-CM | POA: Diagnosis not present

## 2023-08-22 ENCOUNTER — Other Ambulatory Visit: Payer: Self-pay | Admitting: Cardiovascular Disease

## 2023-08-24 DIAGNOSIS — H353221 Exudative age-related macular degeneration, left eye, with active choroidal neovascularization: Secondary | ICD-10-CM | POA: Diagnosis not present

## 2023-08-24 DIAGNOSIS — H353112 Nonexudative age-related macular degeneration, right eye, intermediate dry stage: Secondary | ICD-10-CM | POA: Diagnosis not present

## 2023-09-06 DIAGNOSIS — R600 Localized edema: Secondary | ICD-10-CM | POA: Diagnosis not present

## 2023-09-06 DIAGNOSIS — G3184 Mild cognitive impairment, so stated: Secondary | ICD-10-CM | POA: Diagnosis not present

## 2023-09-06 DIAGNOSIS — I10 Essential (primary) hypertension: Secondary | ICD-10-CM | POA: Diagnosis not present

## 2023-09-06 DIAGNOSIS — I25118 Atherosclerotic heart disease of native coronary artery with other forms of angina pectoris: Secondary | ICD-10-CM | POA: Diagnosis not present

## 2023-09-06 DIAGNOSIS — L821 Other seborrheic keratosis: Secondary | ICD-10-CM | POA: Diagnosis not present

## 2023-09-06 DIAGNOSIS — E440 Moderate protein-calorie malnutrition: Secondary | ICD-10-CM | POA: Diagnosis not present

## 2023-10-05 DIAGNOSIS — H353221 Exudative age-related macular degeneration, left eye, with active choroidal neovascularization: Secondary | ICD-10-CM | POA: Diagnosis not present

## 2023-11-16 DIAGNOSIS — H353112 Nonexudative age-related macular degeneration, right eye, intermediate dry stage: Secondary | ICD-10-CM | POA: Diagnosis not present

## 2023-11-16 DIAGNOSIS — H353231 Exudative age-related macular degeneration, bilateral, with active choroidal neovascularization: Secondary | ICD-10-CM | POA: Diagnosis not present

## 2023-11-28 DIAGNOSIS — R7301 Impaired fasting glucose: Secondary | ICD-10-CM | POA: Diagnosis not present

## 2023-11-28 DIAGNOSIS — D509 Iron deficiency anemia, unspecified: Secondary | ICD-10-CM | POA: Diagnosis not present

## 2023-11-28 DIAGNOSIS — E782 Mixed hyperlipidemia: Secondary | ICD-10-CM | POA: Diagnosis not present

## 2023-11-28 DIAGNOSIS — E039 Hypothyroidism, unspecified: Secondary | ICD-10-CM | POA: Diagnosis not present

## 2023-12-05 DIAGNOSIS — E440 Moderate protein-calorie malnutrition: Secondary | ICD-10-CM | POA: Diagnosis not present

## 2023-12-05 DIAGNOSIS — Z0001 Encounter for general adult medical examination with abnormal findings: Secondary | ICD-10-CM | POA: Diagnosis not present

## 2023-12-05 DIAGNOSIS — E782 Mixed hyperlipidemia: Secondary | ICD-10-CM | POA: Diagnosis not present

## 2023-12-05 DIAGNOSIS — G3184 Mild cognitive impairment, so stated: Secondary | ICD-10-CM | POA: Diagnosis not present

## 2023-12-05 DIAGNOSIS — I10 Essential (primary) hypertension: Secondary | ICD-10-CM | POA: Diagnosis not present

## 2023-12-05 DIAGNOSIS — M25561 Pain in right knee: Secondary | ICD-10-CM | POA: Diagnosis not present

## 2023-12-05 DIAGNOSIS — M79672 Pain in left foot: Secondary | ICD-10-CM | POA: Diagnosis not present

## 2023-12-05 DIAGNOSIS — M1990 Unspecified osteoarthritis, unspecified site: Secondary | ICD-10-CM | POA: Diagnosis not present

## 2023-12-05 DIAGNOSIS — D509 Iron deficiency anemia, unspecified: Secondary | ICD-10-CM | POA: Diagnosis not present

## 2023-12-05 DIAGNOSIS — R600 Localized edema: Secondary | ICD-10-CM | POA: Diagnosis not present

## 2023-12-05 DIAGNOSIS — I25118 Atherosclerotic heart disease of native coronary artery with other forms of angina pectoris: Secondary | ICD-10-CM | POA: Diagnosis not present

## 2023-12-21 DIAGNOSIS — H353231 Exudative age-related macular degeneration, bilateral, with active choroidal neovascularization: Secondary | ICD-10-CM | POA: Diagnosis not present

## 2024-01-10 DIAGNOSIS — H524 Presbyopia: Secondary | ICD-10-CM | POA: Diagnosis not present

## 2024-01-10 DIAGNOSIS — H353221 Exudative age-related macular degeneration, left eye, with active choroidal neovascularization: Secondary | ICD-10-CM | POA: Diagnosis not present

## 2024-01-10 DIAGNOSIS — H40023 Open angle with borderline findings, high risk, bilateral: Secondary | ICD-10-CM | POA: Diagnosis not present

## 2024-01-25 DIAGNOSIS — H353231 Exudative age-related macular degeneration, bilateral, with active choroidal neovascularization: Secondary | ICD-10-CM | POA: Diagnosis not present

## 2024-02-29 DIAGNOSIS — H538 Other visual disturbances: Secondary | ICD-10-CM | POA: Diagnosis not present

## 2024-02-29 DIAGNOSIS — H353231 Exudative age-related macular degeneration, bilateral, with active choroidal neovascularization: Secondary | ICD-10-CM | POA: Diagnosis not present

## 2024-02-29 DIAGNOSIS — H35373 Puckering of macula, bilateral: Secondary | ICD-10-CM | POA: Diagnosis not present

## 2024-02-29 DIAGNOSIS — H353112 Nonexudative age-related macular degeneration, right eye, intermediate dry stage: Secondary | ICD-10-CM | POA: Diagnosis not present

## 2024-04-11 DIAGNOSIS — H353231 Exudative age-related macular degeneration, bilateral, with active choroidal neovascularization: Secondary | ICD-10-CM | POA: Diagnosis not present

## 2024-05-15 DIAGNOSIS — R1110 Vomiting, unspecified: Secondary | ICD-10-CM | POA: Diagnosis not present

## 2024-05-15 DIAGNOSIS — R109 Unspecified abdominal pain: Secondary | ICD-10-CM | POA: Diagnosis not present

## 2024-05-23 DIAGNOSIS — H353231 Exudative age-related macular degeneration, bilateral, with active choroidal neovascularization: Secondary | ICD-10-CM | POA: Diagnosis not present

## 2024-05-30 DIAGNOSIS — R7301 Impaired fasting glucose: Secondary | ICD-10-CM | POA: Diagnosis not present

## 2024-05-30 DIAGNOSIS — E039 Hypothyroidism, unspecified: Secondary | ICD-10-CM | POA: Diagnosis not present

## 2024-05-30 DIAGNOSIS — E782 Mixed hyperlipidemia: Secondary | ICD-10-CM | POA: Diagnosis not present

## 2024-05-30 DIAGNOSIS — D509 Iron deficiency anemia, unspecified: Secondary | ICD-10-CM | POA: Diagnosis not present

## 2024-05-31 LAB — LAB REPORT - SCANNED
A1c: 5.8
Albumin, Urine POC: 22.1
Albumin/Creatinine Ratio, Urine, POC: 22
Creatinine, POC: 102.3 mg/dL
EGFR: 64
Free T4: 1.32 ng/dL
TSH: 2.21 (ref 0.41–5.90)

## 2024-06-05 DIAGNOSIS — I10 Essential (primary) hypertension: Secondary | ICD-10-CM | POA: Diagnosis not present

## 2024-06-05 DIAGNOSIS — M25561 Pain in right knee: Secondary | ICD-10-CM | POA: Diagnosis not present

## 2024-06-05 DIAGNOSIS — M1990 Unspecified osteoarthritis, unspecified site: Secondary | ICD-10-CM | POA: Diagnosis not present

## 2024-06-05 DIAGNOSIS — E440 Moderate protein-calorie malnutrition: Secondary | ICD-10-CM | POA: Diagnosis not present

## 2024-06-05 DIAGNOSIS — R600 Localized edema: Secondary | ICD-10-CM | POA: Diagnosis not present

## 2024-06-05 DIAGNOSIS — M79672 Pain in left foot: Secondary | ICD-10-CM | POA: Diagnosis not present

## 2024-06-05 DIAGNOSIS — Z23 Encounter for immunization: Secondary | ICD-10-CM | POA: Diagnosis not present

## 2024-06-05 DIAGNOSIS — E782 Mixed hyperlipidemia: Secondary | ICD-10-CM | POA: Diagnosis not present

## 2024-06-05 DIAGNOSIS — D509 Iron deficiency anemia, unspecified: Secondary | ICD-10-CM | POA: Diagnosis not present

## 2024-06-05 DIAGNOSIS — I25118 Atherosclerotic heart disease of native coronary artery with other forms of angina pectoris: Secondary | ICD-10-CM | POA: Diagnosis not present

## 2024-06-05 DIAGNOSIS — G3184 Mild cognitive impairment, so stated: Secondary | ICD-10-CM | POA: Diagnosis not present

## 2024-06-19 ENCOUNTER — Encounter: Payer: Self-pay | Admitting: *Deleted

## 2024-06-26 NOTE — Progress Notes (Unsigned)
 Cardiology Clinic Note   Date: 06/27/2024 ID: [name] 1924/12/01, MRN [id number]  Primary Cardiologist:  Jerel Balding, MD  Chief Complaint   [name] is a 89 y.o. female who presents to the clinic today for routine follow up.   Patient Profile   [name] is followed by Dr. Balding for the history outlined below.      Past medical history significant for: CAD. CABG x 3 10/28/2005: LIMA to LAD, SVG to OM, SVG to distal RCA. Nuclear stress test 08/14/2014: Low risk study with small, moderate intensity, fixed distal anterior and apical defect consistent with soft tissue attenuation, no ischemia. RBBB. Hypertension. Hyperlipidemia. Lipid panel 05/25/2023: LDL 59, HDL 57, TG 74, total 131. Migraines. GERD.    In summary, patient underwent CABG x 3 in January 2007.  She has not had new series coronary events since.   Patient was seen in the office by Dr. Balding on 04/12/2022 for routine follow-up.  She reported occasional chest discomfort requiring 1 NTG with resolution.  These episodes occur less than once a month.  She reported occasional orthostatic dizziness but no falls or syncope.  She was found to have difference in blood pressure between right and left upper extremity (lower on the right by 20 mmHg).  She denied symptoms of upper extremity claudication or vertebral steal syndrome.  No medication changes were made.   Patient presented to the ED on 04/24/2023 with syncopal episodes x 2.  Per ED note, patient reported 2 syncopal events in the prior 3 to 4 days.  Both occurred while sitting in a chair when she slumped over and was not responsive.  No seizure activity was noted.  No fall from chair.  Patient's family reported they felt she had fallen asleep in the chair versus syncope.  CBC and CMP were unremarkable, urinalysis was negative.  She was found to be positive for COVID.  Patient was last seen in the office by me on 07/02/2023 for routine follow-up. She  was doing well without cardiac symptoms. She was walking laps at home equaling a 1/4 mile.       History of Present Illness    Today, patient is accompanied by her daughter. She is doing well. Patient denies shortness of breath, dyspnea on exertion, orthopnea or PND. She continues to have mild lower extremity edema she manages with compression socks and Lasix. Daughter states PCP feels neuropathy could be a contributing factor.  No chest pain, pressure, or tightness. No palpitations. Patient stays active walking at home. She is up to 1/2-1 mile a day with excellent tolerance. Patient and daughter have no concerns today.     ROS: All other systems reviewed and are otherwise negative except as noted in History of Present Illness.  EKGs/Labs Reviewed    EKG Interpretation Date/Time:  Friday June 27 2024 10:27:21 EDT Ventricular Rate:  58 PR Interval:  148 QRS Duration:  140 QT Interval:  472 QTC Calculation: 463 R Axis:   59  Text Interpretation: Sinus bradycardia Right bundle branch block When compared with ECG of 24-Apr-2023 11:54, No significant change was found Confirmed by Loistine Sober [id number]) on 06/27/2024 10:34:19 AM    05/31/2024: TSH 2.21   Physical Exam    VS:  BP (!) 130/50 (BP Location: Left Arm, Patient Position: Sitting, Cuff Size: Normal)   Pulse (!) 58   Resp 18   Ht 5' 5 (1.651 m)   Wt 102 lb 9.6 oz (46.5  kg)   SpO2 98%   BMI 17.07 kg/m  , BMI Body mass index is 17.07 kg/m.  GEN: Well nourished, well developed, in no acute distress. Neck: No JVD or carotid bruits. Cardiac:  RRR.  No murmur. No rubs or gallops.   Respiratory:  Respirations regular and unlabored. Clear to auscultation without rales, wheezing or rhonchi. GI: Soft, nontender, nondistended. Extremities: Radials/DP/PT 2+ and equal bilaterally. No clubbing or cyanosis. No edema bilateral lower extremities. Compression socks in place.   Skin: Warm and dry, no rash. Neuro: Strength  intact.  Assessment & Plan   CAD S/p CABG x 04 October 2005. Patient denies chest pain, pressure or tightness. She is active walking at home 1/2-1 mile per day.  - Continue aspirin , amlodipine, isosorbide , metoprolol , as needed SL NTG.  Hypertension BP today 130/50. Patient denies headaches, dizziness or vision changes.  - Continue amlodipine, losartan .  Hyperlipidemia LDL 59 August 2024.  - Continue Lovastatin.   Chronic lower extremity edema Patient reports lower extremities are normal in the morning and progress throughout the day. No orthopnea or PND. It is well managed with compression socks and Lasix. No edema on exam today. Discussed decreasing Lasix to as needed. Daughter states PCP recommends daily dosing. Will defer that decision to patient and family.  - Continue Lasix and compression stockings.   Disposition: Return in 1 year or sooner as needed.          Signed, Barnie HERO. Shalaunda Weatherholtz, DNP, NP-C

## 2024-06-27 ENCOUNTER — Encounter: Payer: Self-pay | Admitting: Student

## 2024-06-27 ENCOUNTER — Ambulatory Visit: Attending: Student | Admitting: Student

## 2024-06-27 VITALS — BP 130/50 | HR 58 | Resp 18 | Ht 65.0 in | Wt 102.6 lb

## 2024-06-27 DIAGNOSIS — R600 Localized edema: Secondary | ICD-10-CM

## 2024-06-27 DIAGNOSIS — I25810 Atherosclerosis of coronary artery bypass graft(s) without angina pectoris: Secondary | ICD-10-CM | POA: Diagnosis not present

## 2024-06-27 DIAGNOSIS — E785 Hyperlipidemia, unspecified: Secondary | ICD-10-CM

## 2024-06-27 DIAGNOSIS — I10 Essential (primary) hypertension: Secondary | ICD-10-CM

## 2024-06-27 NOTE — Patient Instructions (Signed)
 Medication Instructions:   Your physician recommends that you continue on your current medications as directed. Please refer to the Current Medication list given to you today.    *If you need a refill on your cardiac medications before your next appointment, please call your pharmacy*  Lab Work:  None ordered at this time   If you have labs (blood work) drawn today and your tests are completely normal, you will receive your results only by:  MyChart Message (if you have MyChart) OR  A paper copy in the mail If you have any lab test that is abnormal or we need to change your treatment, we will call you to review the results.  Testing/Procedures:  None ordered at this time   Referrals:  None ordered at this time   Follow-Up:  At [facility], you and your health needs are our priority.  As part of our continuing mission to provide you with exceptional heart care, our providers are all part of one team.  This team includes your primary Cardiologist (physician) and Advanced Practice Providers or APPs (Physician Assistants and Nurse Practitioners) who all work together to provide you with the care you need, when you need it.  Your next appointment:   1 year(s)  Provider:    You may see Jerel Balding, MD or one of the following Advanced Practice Providers on your designated Care Team:   Lonni Meager, NP Lesley Maffucci, PA-C Bernardino Bring, PA-C Cadence DeWitt, PA-C Tylene Lunch, NP Barnie Hila, NP    We recommend signing up for the patient portal called MyChart.  Sign up information is provided on this After Visit Summary.  MyChart is used to connect with patients for Virtual Visits (Telemedicine).  Patients are able to view lab/test results, encounter notes, upcoming appointments, etc.  Non-urgent messages can be sent to your provider as well.   To learn more about what you can do with MyChart, go to [email or web address].

## 2024-07-04 DIAGNOSIS — H353231 Exudative age-related macular degeneration, bilateral, with active choroidal neovascularization: Secondary | ICD-10-CM | POA: Diagnosis not present

## 2024-08-09 ENCOUNTER — Other Ambulatory Visit: Payer: Self-pay | Admitting: Cardiovascular Disease
